# Patient Record
Sex: Male | Born: 1960 | Race: White | Hispanic: No | Marital: Married | Smoking: Current every day smoker
Health system: Southern US, Community
[De-identification: ages and names within clinical notes are randomized; demographics above are authoritative.]

## PROBLEM LIST (undated history)

## (undated) DIAGNOSIS — Z789 Other specified health status: Secondary | ICD-10-CM

## (undated) DIAGNOSIS — I10 Essential (primary) hypertension: Secondary | ICD-10-CM

## (undated) DIAGNOSIS — M1990 Unspecified osteoarthritis, unspecified site: Secondary | ICD-10-CM

## (undated) DIAGNOSIS — F1090 Alcohol use, unspecified, uncomplicated: Secondary | ICD-10-CM

## (undated) DIAGNOSIS — R0609 Other forms of dyspnea: Secondary | ICD-10-CM

## (undated) DIAGNOSIS — I82409 Acute embolism and thrombosis of unspecified deep veins of unspecified lower extremity: Secondary | ICD-10-CM

## (undated) DIAGNOSIS — I2510 Atherosclerotic heart disease of native coronary artery without angina pectoris: Secondary | ICD-10-CM

## (undated) DIAGNOSIS — E119 Type 2 diabetes mellitus without complications: Secondary | ICD-10-CM

## (undated) DIAGNOSIS — Z7289 Other problems related to lifestyle: Secondary | ICD-10-CM

## (undated) DIAGNOSIS — I739 Peripheral vascular disease, unspecified: Secondary | ICD-10-CM

## (undated) HISTORY — PX: RECONSTRUCTION OF NOSE: SHX2301

## (undated) HISTORY — DX: Other specified health status: Z78.9

## (undated) HISTORY — PX: CATARACT EXTRACTION: SUR2

## (undated) HISTORY — DX: Other problems related to lifestyle: Z72.89

## (undated) HISTORY — DX: Alcohol use, unspecified, uncomplicated: F10.90

## (undated) HISTORY — DX: Acute embolism and thrombosis of unspecified deep veins of unspecified lower extremity: I82.409

---

## 2007-06-29 ENCOUNTER — Encounter: Admission: RE | Admit: 2007-06-29 | Discharge: 2007-06-29 | Payer: Self-pay | Admitting: Family Medicine

## 2007-06-29 ENCOUNTER — Ambulatory Visit: Payer: Self-pay | Admitting: Family Medicine

## 2007-06-29 DIAGNOSIS — M25519 Pain in unspecified shoulder: Secondary | ICD-10-CM

## 2007-06-29 DIAGNOSIS — M79609 Pain in unspecified limb: Secondary | ICD-10-CM

## 2007-06-29 HISTORY — DX: Pain in unspecified limb: M79.609

## 2007-06-29 HISTORY — DX: Pain in unspecified shoulder: M25.519

## 2007-06-30 ENCOUNTER — Telehealth (INDEPENDENT_AMBULATORY_CARE_PROVIDER_SITE_OTHER): Payer: Self-pay | Admitting: *Deleted

## 2007-07-11 ENCOUNTER — Ambulatory Visit: Payer: Self-pay | Admitting: Family Medicine

## 2007-07-12 ENCOUNTER — Encounter: Payer: Self-pay | Admitting: Family Medicine

## 2007-07-12 LAB — CONVERTED CEMR LAB
BUN: 14 mg/dL (ref 6–23)
CO2: 20 meq/L (ref 19–32)
Calcium: 9.6 mg/dL (ref 8.4–10.5)
Chloride: 105 meq/L (ref 96–112)
Cholesterol: 190 mg/dL (ref 0–200)
Glucose, Bld: 81 mg/dL (ref 70–99)
Potassium: 4 meq/L (ref 3.5–5.3)
Sodium: 139 meq/L (ref 135–145)
Total CHOL/HDL Ratio: 4
Total Protein: 7.5 g/dL (ref 6.0–8.3)
VLDL: 29 mg/dL (ref 0–40)

## 2007-07-27 ENCOUNTER — Encounter: Payer: Self-pay | Admitting: Family Medicine

## 2008-10-14 ENCOUNTER — Ambulatory Visit: Payer: Self-pay | Admitting: Occupational Medicine

## 2008-10-25 ENCOUNTER — Encounter: Payer: Self-pay | Admitting: Family Medicine

## 2008-12-06 ENCOUNTER — Encounter: Payer: Self-pay | Admitting: Family Medicine

## 2014-11-12 DIAGNOSIS — M75102 Unspecified rotator cuff tear or rupture of left shoulder, not specified as traumatic: Secondary | ICD-10-CM | POA: Insufficient documentation

## 2014-11-12 HISTORY — DX: Unspecified rotator cuff tear or rupture of left shoulder, not specified as traumatic: M75.102

## 2014-12-02 DIAGNOSIS — H25813 Combined forms of age-related cataract, bilateral: Secondary | ICD-10-CM | POA: Insufficient documentation

## 2015-01-08 DIAGNOSIS — F17200 Nicotine dependence, unspecified, uncomplicated: Secondary | ICD-10-CM

## 2015-01-08 DIAGNOSIS — R0683 Snoring: Secondary | ICD-10-CM

## 2015-01-08 HISTORY — DX: Nicotine dependence, unspecified, uncomplicated: F17.200

## 2015-01-08 HISTORY — DX: Snoring: R06.83

## 2015-01-21 DIAGNOSIS — Z961 Presence of intraocular lens: Secondary | ICD-10-CM | POA: Insufficient documentation

## 2015-01-21 DIAGNOSIS — Z9849 Cataract extraction status, unspecified eye: Secondary | ICD-10-CM

## 2015-01-21 HISTORY — DX: Presence of intraocular lens: Z98.49

## 2020-11-14 ENCOUNTER — Other Ambulatory Visit: Payer: Self-pay

## 2020-11-14 ENCOUNTER — Encounter: Payer: Self-pay | Admitting: Medical-Surgical

## 2020-11-14 ENCOUNTER — Ambulatory Visit (INDEPENDENT_AMBULATORY_CARE_PROVIDER_SITE_OTHER): Payer: 59 | Admitting: Medical-Surgical

## 2020-11-14 VITALS — BP 147/62 | HR 80 | Temp 98.0°F | Ht 68.25 in | Wt 214.1 lb

## 2020-11-14 DIAGNOSIS — R7309 Other abnormal glucose: Secondary | ICD-10-CM

## 2020-11-14 DIAGNOSIS — Z7689 Persons encountering health services in other specified circumstances: Secondary | ICD-10-CM | POA: Diagnosis not present

## 2020-11-14 DIAGNOSIS — Z23 Encounter for immunization: Secondary | ICD-10-CM | POA: Diagnosis not present

## 2020-11-14 DIAGNOSIS — J329 Chronic sinusitis, unspecified: Secondary | ICD-10-CM

## 2020-11-14 DIAGNOSIS — J40 Bronchitis, not specified as acute or chronic: Secondary | ICD-10-CM

## 2020-11-14 DIAGNOSIS — Z1159 Encounter for screening for other viral diseases: Secondary | ICD-10-CM

## 2020-11-14 DIAGNOSIS — J302 Other seasonal allergic rhinitis: Secondary | ICD-10-CM

## 2020-11-14 DIAGNOSIS — R030 Elevated blood-pressure reading, without diagnosis of hypertension: Secondary | ICD-10-CM | POA: Diagnosis not present

## 2020-11-14 DIAGNOSIS — Z114 Encounter for screening for human immunodeficiency virus [HIV]: Secondary | ICD-10-CM

## 2020-11-14 DIAGNOSIS — Z1211 Encounter for screening for malignant neoplasm of colon: Secondary | ICD-10-CM

## 2020-11-14 DIAGNOSIS — Z1329 Encounter for screening for other suspected endocrine disorder: Secondary | ICD-10-CM

## 2020-11-14 DIAGNOSIS — Z0000 Encounter for general adult medical examination without abnormal findings: Secondary | ICD-10-CM

## 2020-11-14 DIAGNOSIS — J4520 Mild intermittent asthma, uncomplicated: Secondary | ICD-10-CM

## 2020-11-14 LAB — CBC WITH DIFFERENTIAL/PLATELET
Absolute Monocytes: 248 cells/uL (ref 200–950)
Eosinophils Absolute: 110 cells/uL (ref 15–500)
Monocytes Relative: 3.6 %
Neutrophils Relative %: 61.7 %
Platelets: 136 10*3/uL — ABNORMAL LOW (ref 140–400)

## 2020-11-14 MED ORDER — AZITHROMYCIN 250 MG PO TABS: ORAL_TABLET | ORAL | 0 refills | Status: DC

## 2020-11-14 NOTE — Patient Instructions (Signed)
Tdap (Tetanus, Diphtheria, Pertussis) Vaccine: What You Need to Know 1. Why get vaccinated? Tdap vaccine can prevent tetanus, diphtheria, and pertussis. Diphtheria and pertussis spread from person to person. Tetanus enters the body through cuts or wounds.  TETANUS (T) causes painful stiffening of the muscles. Tetanus can lead to serious health problems, including being unable to open the mouth, having trouble swallowing and breathing, or death.  DIPHTHERIA (D) can lead to difficulty breathing, heart failure, paralysis, or death.  PERTUSSIS (aP), also known as "whooping cough," can cause uncontrollable, violent coughing that makes it hard to breathe, eat, or drink. Pertussis can be extremely serious especially in babies and young children, causing pneumonia, convulsions, brain damage, or death. In teens and adults, it can cause weight loss, loss of bladder control, passing out, and rib fractures from severe coughing. 2. Tdap vaccine Tdap is only for children 7 years and older, adolescents, and adults.  Adolescents should receive a single dose of Tdap, preferably at age 11 or 12 years. Pregnant people should get a dose of Tdap during every pregnancy, preferably during the early part of the third trimester, to help protect the newborn from pertussis. Infants are most at risk for severe, life-threatening complications from pertussis. Adults who have never received Tdap should get a dose of Tdap. Also, adults should receive a booster dose of either Tdap or Td (a different vaccine that protects against tetanus and diphtheria but not pertussis) every 10 years, or after 5 years in the case of a severe or dirty wound or burn. Tdap may be given at the same time as other vaccines. 3. Talk with your health care provider Tell your vaccine provider if the person getting the vaccine:  Has had an allergic reaction after a previous dose of any vaccine that protects against tetanus, diphtheria, or pertussis, or  has any severe, life-threatening allergies  Has had a coma, decreased level of consciousness, or prolonged seizures within 7 days after a previous dose of any pertussis vaccine (DTP, DTaP, or Tdap)  Has seizures or another nervous system problem  Has ever had Guillain-Barr Syndrome (also called "GBS")  Has had severe pain or swelling after a previous dose of any vaccine that protects against tetanus or diphtheria In some cases, your health care provider may decide to postpone Tdap vaccination until a future visit. People with minor illnesses, such as a cold, may be vaccinated. People who are moderately or severely ill should usually wait until they recover before getting Tdap vaccine.  Your health care provider can give you more information. 4. Risks of a vaccine reaction  Pain, redness, or swelling where the shot was given, mild fever, headache, feeling tired, and nausea, vomiting, diarrhea, or stomachache sometimes happen after Tdap vaccination. People sometimes faint after medical procedures, including vaccination. Tell your provider if you feel dizzy or have vision changes or ringing in the ears.  As with any medicine, there is a very remote chance of a vaccine causing a severe allergic reaction, other serious injury, or death. 5. What if there is a serious problem? An allergic reaction could occur after the vaccinated person leaves the clinic. If you see signs of a severe allergic reaction (hives, swelling of the face and throat, difficulty breathing, a fast heartbeat, dizziness, or weakness), call 9-1-1 and get the person to the nearest hospital. For other signs that concern you, call your health care provider.  Adverse reactions should be reported to the Vaccine Adverse Event Reporting System (VAERS). Your health   care provider will usually file this report, or you can do it yourself. Visit the VAERS website at www.vaers.hhs.gov or call 1-800-822-7967. VAERS is only for reporting  reactions, and VAERS staff members do not give medical advice. 6. The National Vaccine Injury Compensation Program The National Vaccine Injury Compensation Program (VICP) is a federal program that was created to compensate people who may have been injured by certain vaccines. Claims regarding alleged injury or death due to vaccination have a time limit for filing, which may be as short as two years. Visit the VICP website at www.hrsa.gov/vaccinecompensation or call 1-800-338-2382 to learn about the program and about filing a claim. 7. How can I learn more?  Ask your health care provider.  Call your local or state health department.  Visit the website of the Food and Drug Administration (FDA) for vaccine package inserts and additional information at www.fda.gov/vaccines-blood-biologics/vaccines.  Contact the Centers for Disease Control and Prevention (CDC): ? Call 1-800-232-4636 (1-800-CDC-INFO) or ? Visit CDC's website at www.cdc.gov/vaccines. Vaccine Information Statement Tdap (Tetanus, Diphtheria, Pertussis) Vaccine (03/21/2020) This information is not intended to replace advice given to you by your health care provider. Make sure you discuss any questions you have with your health care provider. Document Revised: 04/16/2020 Document Reviewed: 04/16/2020 Elsevier Patient Education  2021 Elsevier Inc.  

## 2020-11-14 NOTE — Progress Notes (Signed)
New Patient Office Visit  Subjective:  Patient ID: Spencer Bishop, male    DOB: 03/18/1961  Age: 60 y.o. MRN: [id number]  CC:  Chief Complaint  Patient presents with  . Establish Care    HPI UNO ESAU presents to establish care.   History of asthma diagnosed in his childhood.  Usually only flares when seasonal allergies flareup.  Uses an albuterol inhaler up to 2 times daily to help manage his symptoms at most but there are times when he is able to go without having to use it.  Over the last 2 weeks, his seasonal allergies have gotten the best at him and he is struggling with sinus pressure/congestion, coughing, postnasal drip, and facial pain.  He has started using Flonase but this has not made much of a difference over the last week.  Blood pressure is elevated in office today at 147/62.  Wife notes that it was worse last year and has improved.  He does add salt to his foods.  He has a physically demanding job but no intentional exercise.  Wife notes that lab work done around 1 year ago showed an elevated glucose but he has never been diagnosed with diabetes.  Past Medical History:  Diagnosis Date  . Alcohol use     Past Surgical History:  Procedure Laterality Date  . RECONSTRUCTION OF NOSE      Family History  Problem Relation Age of Onset  . Heart attack Mother   . Diabetes Mother   . Heart attack Father     Social History   Socioeconomic History  . Marital status: Married    Spouse name: Not on file  . Number of children: Not on file  . Years of education: Not on file  . Highest education level: Not on file  Occupational History  . Not on file  Tobacco Use  . Smoking status: Current Every Day Smoker    Packs/day: 1.50    Types: Cigarettes  . Smokeless tobacco: Never Used  Substance and Sexual Activity  . Alcohol use: Yes    Alcohol/week: 7.0 - 14.0 standard drinks    Types: 7 - 14 Standard drinks or equivalent per week  . Drug use: Yes    Frequency:  7.0 times per week    Types: Marijuana    Comment: daily use  . Sexual activity: Yes    Partners: Female    Birth control/protection: Surgical  Other Topics Concern  . Not on file  Social History Narrative  . Not on file   Social Determinants of Health   Financial Resource Strain: Not on file  Food Insecurity: Not on file  Transportation Needs: Not on file  Physical Activity: Not on file  Stress: Not on file  Social Connections: Not on file  Intimate Partner Violence: Not on file    ROS Review of Systems  Constitutional: Negative for chills, fatigue, fever and unexpected weight change.  HENT: Positive for congestion, hearing loss, postnasal drip and voice change.   Respiratory: Positive for cough and wheezing.   Cardiovascular: Negative for chest pain, palpitations and leg swelling.  Gastrointestinal: Positive for abdominal pain (Heartburn). Negative for constipation, diarrhea, nausea and vomiting.  Genitourinary: Negative for dysuria, frequency and urgency.  Musculoskeletal: Positive for arthralgias and myalgias.  Neurological: Positive for headaches (With increased sinus pressure). Negative for dizziness and light-headedness.  Hematological: Bruises/bleeds easily.  Psychiatric/Behavioral: Negative for dysphoric mood, self-injury, sleep disturbance and suicidal ideas. The patient is not nervous/anxious.  Objective:   Today's Vitals: BP (!) 147/62   Pulse 80   Temp 98 F (36.7 C)   Ht 5' 8.25" (1.734 m)   Wt 214 lb 1.6 oz (97.1 kg)   SpO2 95%   BMI 32.32 kg/m   Physical Exam Vitals and nursing note reviewed.  Constitutional:      General: He is not in acute distress.    Appearance: Normal appearance.  HENT:     Head: Normocephalic and atraumatic.  Cardiovascular:     Rate and Rhythm: Normal rate and regular rhythm.     Pulses: Normal pulses.     Heart sounds: Normal heart sounds. No murmur heard. No friction rub. No gallop.   Pulmonary:     Effort:  Pulmonary effort is normal. No respiratory distress.     Breath sounds: Normal breath sounds.  Skin:    General: Skin is warm and dry.  Neurological:     Mental Status: He is alert and oriented to person, place, and time.  Psychiatric:        Mood and Affect: Mood normal.        Behavior: Behavior normal.        Thought Content: Thought content normal.        Judgment: Judgment normal.    Assessment & Plan:   1. Encounter to establish care Reviewed available information and discussed healthcare concerns with patient.  2. Elevated blood pressure reading in office without diagnosis of hypertension Blood pressure elevated today with elevation in the past (not available in care everywhere).  Checking CBC with differential and CMP.  Recommend limiting dietary sodium and avoiding processed/fast foods. - CBC with Differential/Platelet - COMPLETE METABOLIC PANEL WITH GFR  3. Elevated glucose With a history of elevated glucose, checking a hemoglobin A1c today. - Hemoglobin A1c  4. Sinobronchitis Seasonal allergies with progressive worsening over the past 2 weeks and no improvement with home interventions.  Start azithromycin 500 mg on day 1 then 250 mg daily x4 days.  5. Preventative health care Checking cholesterol panel. - Lipid panel  6. Mild intermittent asthma without complication Continue albuterol as needed.  Consider adding Symbicort for better control.  7. Screening for endocrine disorder Checking TSH and hemoglobin A1c. - TSH - Hemoglobin A1c  8. Seasonal allergies Continue Flonase daily.  Recommend adding daily antihistamine such as Zyrtec, Allegra, Claritin, or Xyzal.   Outpatient Encounter Medications as of 11/14/2020  Medication Sig  . albuterol (VENTOLIN HFA) 108 (90 Base) MCG/ACT inhaler Inhale 2 puffs into the lungs every 6 (six) hours as needed for wheezing or cough.  Marland Kitchen azithromycin (ZITHROMAX) 250 MG tablet 2 tabs po x1 on Day 1, then 1 tab po daily on Days 2  - 5  . dextromethorphan-guaiFENesin (MUCINEX DM) 30-600 MG 12hr tablet Take 1 tablet by mouth every 12 (twelve) hours.  . naproxen sodium (ALEVE) 220 MG tablet Take 220 mg by mouth 2 (two) times daily as needed.   No facility-administered encounter medications on file as of 11/14/2020.    Follow-up: Return in about 2 weeks (around 11/28/2020) for nurse visit for BP check.   Thayer Ohm, DNP, APRN, FNP-BC [facility]

## 2020-11-15 LAB — COMPLETE METABOLIC PANEL WITH GFR
AG Ratio: 1.8 (calc) (ref 1.0–2.5)
ALT: 21 U/L (ref 9–46)
AST: 19 U/L (ref 10–35)
Albumin: 4.4 g/dL (ref 3.6–5.1)
Alkaline phosphatase (APISO): 147 U/L — ABNORMAL HIGH (ref 35–144)
BUN: 11 mg/dL (ref 7–25)
CO2: 24 mmol/L (ref 20–32)
Calcium: 9.6 mg/dL (ref 8.6–10.3)
Chloride: 99 mmol/L (ref 98–110)
Creat: 0.89 mg/dL (ref 0.70–1.33)
GFR, Est African American: 109 mL/min/{1.73_m2} (ref >=60)
GFR, Est Non African American: 94 mL/min/{1.73_m2} (ref >=60)
Globulin: 2.5 g/dL (calc) (ref 1.9–3.7)
Glucose, Bld: 266 mg/dL — ABNORMAL HIGH (ref 65–99)
Potassium: 4.5 mmol/L (ref 3.5–5.3)
Sodium: 134 mmol/L — ABNORMAL LOW (ref 135–146)
Total Bilirubin: 0.4 mg/dL (ref 0.2–1.2)
Total Protein: 6.9 g/dL (ref 6.1–8.1)

## 2020-11-15 LAB — CBC WITH DIFFERENTIAL/PLATELET
Basophils Absolute: 69 cells/uL (ref 0–200)
Basophils Relative: 1 %
Eosinophils Relative: 1.6 %
HCT: 49.5 % (ref 38.5–50.0)
Hemoglobin: 16.7 g/dL (ref 13.2–17.1)
Lymphs Abs: 2215 cells/uL (ref 850–3900)
MCH: 32.9 pg (ref 27.0–33.0)
MCHC: 33.7 g/dL (ref 32.0–36.0)
MCV: 97.4 fL (ref 80.0–100.0)
MPV: 10.8 fL (ref 7.5–12.5)
Neutro Abs: 4257 cells/uL (ref 1500–7800)
RBC: 5.08 10*6/uL (ref 4.20–5.80)
RDW: 12.7 % (ref 11.0–15.0)
Total Lymphocyte: 32.1 %
WBC: 6.9 10*3/uL (ref 3.8–10.8)

## 2020-11-15 LAB — TSH: TSH: 0.79 mIU/L (ref 0.40–4.50)

## 2020-11-15 LAB — LIPID PANEL
Cholesterol: 170 mg/dL (ref <200)
HDL: 43 mg/dL (ref >=40)
LDL Cholesterol (Calc): 91 mg/dL (calc)
Non-HDL Cholesterol (Calc): 127 mg/dL (calc) (ref <130)
Total CHOL/HDL Ratio: 4 (calc) (ref <5.0)
Triglycerides: 273 mg/dL — ABNORMAL HIGH (ref <150)

## 2020-11-15 LAB — HEMOGLOBIN A1C
Hgb A1c MFr Bld: 11.6 % of total Hgb — ABNORMAL HIGH (ref <5.7)
Mean Plasma Glucose: 286 mg/dL
eAG (mmol/L): 15.9 mmol/L

## 2020-11-18 ENCOUNTER — Ambulatory Visit (INDEPENDENT_AMBULATORY_CARE_PROVIDER_SITE_OTHER): Payer: 59 | Admitting: Medical-Surgical

## 2020-11-18 ENCOUNTER — Encounter: Payer: Self-pay | Admitting: Medical-Surgical

## 2020-11-18 ENCOUNTER — Other Ambulatory Visit: Payer: Self-pay

## 2020-11-18 VITALS — BP 159/85 | HR 76 | Temp 98.4°F | Ht 68.25 in | Wt 213.6 lb

## 2020-11-18 DIAGNOSIS — E1165 Type 2 diabetes mellitus with hyperglycemia: Secondary | ICD-10-CM

## 2020-11-18 MED ORDER — METFORMIN HCL ER 500 MG PO TB24: ORAL_TABLET | ORAL | 11 refills | Status: DC

## 2020-11-18 MED ORDER — BLOOD GLUCOSE MONITOR KIT: PACK | 0 refills | Status: AC

## 2020-11-18 NOTE — Progress Notes (Signed)
Subjective:    CC: Discuss lab results  HPI: Pleasant 60 year old male accompanied by his wife presenting today to discuss his recent lab results.  His hemoglobin A1c resulted at 11.6 indicating new onset diabetes.  He does have a family history of diabetes in his mother.  No numbness or tingling to his feet and performs frequent foot care throughout the day, changing his socks several times daily.  History of cataracts with surgical intervention but no recent optometry/ophthalmology evaluation.  I reviewed the past medical history, family history, social history, surgical history, and allergies today and no changes were needed.  Please see the problem list section below in epic for further details.  Past Medical History: Past Medical History:  Diagnosis Date  . Alcohol use    Past Surgical History: Past Surgical History:  Procedure Laterality Date  . RECONSTRUCTION OF NOSE     Social History: Social History   Socioeconomic History  . Marital status: Married    Spouse name: Not on file  . Number of children: Not on file  . Years of education: Not on file  . Highest education level: Not on file  Occupational History  . Not on file  Tobacco Use  . Smoking status: Current Every Day Smoker    Packs/day: 1.50    Types: Cigarettes  . Smokeless tobacco: Never Used  Substance and Sexual Activity  . Alcohol use: Yes    Alcohol/week: 7.0 - 14.0 standard drinks    Types: 7 - 14 Standard drinks or equivalent per week  . Drug use: Yes    Frequency: 7.0 times per week    Types: Marijuana    Comment: daily use  . Sexual activity: Yes    Partners: Female    Birth control/protection: Surgical  Other Topics Concern  . Not on file  Social History Narrative  . Not on file   Social Determinants of Health   Financial Resource Strain: Not on file  Food Insecurity: Not on file  Transportation Needs: Not on file  Physical Activity: Not on file  Stress: Not on file  Social  Connections: Not on file   Family History: Family History  Problem Relation Age of Onset  . Heart attack Mother   . Diabetes Mother   . Heart attack Father    Allergies: Allergies  Allergen Reactions  . Tylenol [Acetaminophen] Itching   Medications: See med rec.  Review of Systems: See HPI for pertinent positives and negatives.   Objective:    General: Well Developed, well nourished, and in no acute distress.  Neuro: Alert and oriented x3.  HEENT: Normocephalic, atraumatic.  Skin: Warm and dry. Cardiac: Regular rate and rhythm.  Respiratory:  Not using accessory muscles, speaking in full sentences.   Impression and Recommendations:    1. Type 2 diabetes mellitus with hyperglycemia, without long-term current use of insulin (HCC) Starting Metformin 500 mg XR daily for 7 days then increase to twice daily with breakfast and dinner.  Prescription printed for glucometer and provided to patient with instructions to take it to his pharmacy or the pharmacist will show him what glucometer is covered by his insurance.  Printed information regarding diabetic nutrition provided.  Glucose monitoring logs printed and given to patient.  Recommend checking fasting glucose first thing in the morning daily with occasionally checking 2 hours postprandially.  Discussed diabetic care recommendations to start ACE/ARB as well as a statin medication.  Because he has historically not taken a lot of medications, we  will start Metformin first and if tolerating then look to add in other medications.  Referral to optometry placed for diabetic eye exam.  Diabetic foot exam completed today.  Unable to provide urine specimen for urine microalbumin. - Ambulatory referral to Optometry  Return for nurse visit for BP check as scheduled. ___________________________________________ Thayer Ohm, DNP, APRN, FNP-BC Primary Care and Sports Medicine [facility]

## 2020-12-02 ENCOUNTER — Ambulatory Visit (INDEPENDENT_AMBULATORY_CARE_PROVIDER_SITE_OTHER): Payer: 59 | Admitting: Medical-Surgical

## 2020-12-02 ENCOUNTER — Other Ambulatory Visit: Payer: Self-pay

## 2020-12-02 VITALS — BP 145/81 | HR 65 | Temp 98.5°F | Resp 20 | Ht 68.25 in | Wt 214.0 lb

## 2020-12-02 DIAGNOSIS — R030 Elevated blood-pressure reading, without diagnosis of hypertension: Secondary | ICD-10-CM

## 2020-12-02 MED ORDER — VALSARTAN 40 MG PO TABS: 40.0000 mg | ORAL_TABLET | Freq: Every day | ORAL | 3 refills | Status: DC

## 2020-12-02 NOTE — Progress Notes (Signed)
Established Patient Office Visit  Subjective:  Patient ID: BAKER MORONTA, male    DOB: [birth year]  Age: 60 y.o. MRN: [id number]  CC:  Chief Complaint  Patient presents with  . Hypertension    HPI Spencer Bishop presents for a BP check. First BP 160/81, Second BP 145/81.   Past Medical History:  Diagnosis Date  . Alcohol use     Past Surgical History:  Procedure Laterality Date  . RECONSTRUCTION OF NOSE      Family History  Problem Relation Age of Onset  . Heart attack Mother   . Diabetes Mother   . Heart attack Father     Social History   Socioeconomic History  . Marital status: Married    Spouse name: Not on file  . Number of children: Not on file  . Years of education: Not on file  . Highest education level: Not on file  Occupational History  . Not on file  Tobacco Use  . Smoking status: Current Every Day Smoker    Packs/day: 1.50    Types: Cigarettes  . Smokeless tobacco: Never Used  Substance and Sexual Activity  . Alcohol use: Yes    Alcohol/week: 7.0 - 14.0 standard drinks    Types: 7 - 14 Standard drinks or equivalent per week  . Drug use: Yes    Frequency: 7.0 times per week    Types: Marijuana    Comment: daily use  . Sexual activity: Yes    Partners: Female    Birth control/protection: Surgical  Other Topics Concern  . Not on file  Social History Narrative  . Not on file   Social Determinants of Health   Financial Resource Strain: Not on file  Food Insecurity: Not on file  Transportation Needs: Not on file  Physical Activity: Not on file  Stress: Not on file  Social Connections: Not on file  Intimate Partner Violence: Not on file    Outpatient Medications Prior to Visit  Medication Sig Dispense Refill  . albuterol (VENTOLIN HFA) 108 (90 Base) MCG/ACT inhaler Inhale 2 puffs into the lungs every 6 (six) hours as needed for wheezing or cough.    Marland Kitchen azithromycin (ZITHROMAX) 250 MG tablet 2 tabs po x1 on Day 1, then 1 tab po daily on  Days 2 - 5 6 tablet 0  . blood glucose meter kit and supplies KIT Dispense based on patient and insurance preference. Use up to four times daily as directed. Please include lancets, test strips, control solution. 1 each 0  . dextromethorphan-guaiFENesin (MUCINEX DM) 30-600 MG 12hr tablet Take 1 tablet by mouth every 12 (twelve) hours.    . metFORMIN (GLUCOPHAGE XR) 500 MG 24 hr tablet Take 1 tablet (500 mg total) by mouth daily with breakfast for 7 days, THEN 1 tablet (500 mg total) in the morning and at bedtime. 60 tablet 11  . naproxen sodium (ALEVE) 220 MG tablet Take 220 mg by mouth 2 (two) times daily as needed.     No facility-administered medications prior to visit.    Allergies  Allergen Reactions  . Tylenol [Acetaminophen] Itching    ROS Review of Systems    Objective:    Physical Exam  BP (!) 160/81 (BP Location: Left Arm, Patient Position: Sitting, Cuff Size: Large)   Pulse 65   Temp 98.5 F (36.9 C) (Oral)   Resp 20   Ht 5' 8.25" (1.734 m)   Wt 214 lb (97.1 kg)   SpO2  100%   BMI 32.30 kg/m  Wt Readings from Last 3 Encounters:  12/02/20 214 lb (97.1 kg)  11/18/20 213 lb 9.6 oz (96.9 kg)  11/14/20 214 lb 1.6 oz (97.1 kg)     Health Maintenance Due  Topic Date Due  . Hepatitis C Screening  Never done  . COVID-19 Vaccine (1) Never done  . URINE MICROALBUMIN  Never done  . HIV Screening  Never done  . COLONOSCOPY (Pts 45-56yr Insurance coverage will need to be confirmed)  Never done    There are no preventive care reminders to display for this patient.  Lab Results  Component Value Date   TSH 0.79 11/14/2020   Lab Results  Component Value Date   WBC 6.9 11/14/2020   HGB 16.7 11/14/2020   HCT 49.5 11/14/2020   MCV 97.4 11/14/2020   PLT 136 (L) 11/14/2020   Lab Results  Component Value Date   NA 134 (L) 11/14/2020   K 4.5 11/14/2020   CO2 24 11/14/2020   GLUCOSE 266 (H) 11/14/2020   BUN 11 11/14/2020   CREATININE 0.89 11/14/2020   BILITOT  0.4 11/14/2020   ALKPHOS 99 07/12/2007   AST 19 11/14/2020   ALT 21 11/14/2020   PROT 6.9 11/14/2020   ALBUMIN 4.9 07/12/2007   CALCIUM 9.6 11/14/2020   Lab Results  Component Value Date   CHOL 170 11/14/2020   Lab Results  Component Value Date   HDL 43 11/14/2020   Lab Results  Component Value Date   LDLCALC 91 11/14/2020   Lab Results  Component Value Date   TRIG 273 (H) 11/14/2020   Lab Results  Component Value Date   CHOLHDL 4.0 11/14/2020   Lab Results  Component Value Date   HGBA1C 11.6 (H) 11/14/2020      Assessment & Plan:  Continue current medications, [facility] Follow up in two weeks for a BP check. Problem List Items Addressed This Visit   None   Visit Diagnoses    Elevated blood pressure reading in office without diagnosis of hypertension    -  Primary      No orders of the defined types were placed in this encounter.   Follow-up: Return in about 2 weeks (around 12/16/2020) for BP check.    Spencer Bishop CMA

## 2020-12-16 ENCOUNTER — Other Ambulatory Visit: Payer: Self-pay

## 2020-12-16 ENCOUNTER — Ambulatory Visit (INDEPENDENT_AMBULATORY_CARE_PROVIDER_SITE_OTHER): Payer: 59 | Admitting: Medical-Surgical

## 2020-12-16 VITALS — BP 128/69 | HR 68

## 2020-12-16 DIAGNOSIS — E1165 Type 2 diabetes mellitus with hyperglycemia: Secondary | ICD-10-CM

## 2020-12-16 DIAGNOSIS — I10 Essential (primary) hypertension: Secondary | ICD-10-CM

## 2020-12-16 MED ORDER — FREESTYLE LITE TEST VI STRP: ORAL_STRIP | 12 refills | Status: AC

## 2020-12-16 MED ORDER — FREESTYLE LANCETS MISC: 12 refills | Status: AC

## 2020-12-16 NOTE — Progress Notes (Signed)
Blood pressure well controlled. Continue Valsartan 40mg  daily. Blood sugars look fantastic. Continue Metformin as prescribed.   , DNP, APRN, FNP-BC [facility] and [facility] Medicine

## 2020-12-16 NOTE — Progress Notes (Signed)
Established Patient Office Visit  Subjective:  Patient ID: Spencer Bishop, male    DOB: January 31, 1961  Age: 60 y.o. MRN: [id number]  CC:  Chief Complaint  Patient presents with  . Hypertension    HPI AVIER Bishop presents for blood pressure check. Denies chest pain, shortness of breath or headaches.   DM - He needs more strips and lancets for his FreeStyle Lite meter. Fasting blood sugars look good. He states she has switched from Pepsi to water daily.    4/21 - 117 mg/dl 4/22 - 116 mg/dl 4/23 - 108 mg/dl 4/24 - 113 mg/dl 4/25 - 101 mg/dl 4/26 - 108 mg/dl 4/27 - 115 mg/dl 4/28 - 103 mg/dl 4/29 - 127 mg/dl 5/1   - 129 mg/dl 5/2   - 102 mg/dl 5/3   - 110 mg/dl  Past Medical History:  Diagnosis Date  . Alcohol use     Past Surgical History:  Procedure Laterality Date  . RECONSTRUCTION OF NOSE      Family History  Problem Relation Age of Onset  . Heart attack Mother   . Diabetes Mother   . Heart attack Father     Social History   Socioeconomic History  . Marital status: Married    Spouse name: Not on file  . Number of children: Not on file  . Years of education: Not on file  . Highest education level: Not on file  Occupational History  . Not on file  Tobacco Use  . Smoking status: Current Every Day Smoker    Packs/day: 1.50    Types: Cigarettes  . Smokeless tobacco: Never Used  Substance and Sexual Activity  . Alcohol use: Yes    Alcohol/week: 7.0 - 14.0 standard drinks    Types: 7 - 14 Standard drinks or equivalent per week  . Drug use: Yes    Frequency: 7.0 times per week    Types: Marijuana    Comment: daily use  . Sexual activity: Yes    Partners: Female    Birth control/protection: Surgical  Other Topics Concern  . Not on file  Social History Narrative  . Not on file   Social Determinants of Health   Financial Resource Strain: Not on file  Food Insecurity: Not on file  Transportation Needs: Not on file  Physical Activity: Not on file   Stress: Not on file  Social Connections: Not on file  Intimate Partner Violence: Not on file    Outpatient Medications Prior to Visit  Medication Sig Dispense Refill  . albuterol (VENTOLIN HFA) 108 (90 Base) MCG/ACT inhaler Inhale 2 puffs into the lungs every 6 (six) hours as needed for wheezing or cough.    . blood glucose meter kit and supplies KIT Dispense based on patient and insurance preference. Use up to four times daily as directed. Please include lancets, test strips, control solution. 1 each 0  . metFORMIN (GLUCOPHAGE XR) 500 MG 24 hr tablet Take 1 tablet (500 mg total) by mouth daily with breakfast for 7 days, THEN 1 tablet (500 mg total) in the morning and at bedtime. 60 tablet 11  . naproxen sodium (ALEVE) 220 MG tablet Take 220 mg by mouth 2 (two) times daily as needed.    . valsartan (DIOVAN) 40 MG tablet Take 1 tablet (40 mg total) by mouth daily. 90 tablet 3  . azithromycin (ZITHROMAX) 250 MG tablet 2 tabs po x1 on Day 1, then 1 tab po daily on Days 2 -  5 6 tablet 0  . dextromethorphan-guaiFENesin (MUCINEX DM) 30-600 MG 12hr tablet Take 1 tablet by mouth every 12 (twelve) hours.     No facility-administered medications prior to visit.    Allergies  Allergen Reactions  . Tylenol [Acetaminophen] Itching    ROS Review of Systems    Objective:    Physical Exam  BP 128/69   Pulse 68   SpO2 100%  Wt Readings from Last 3 Encounters:  12/02/20 214 lb (97.1 kg)  11/18/20 213 lb 9.6 oz (96.9 kg)  11/14/20 214 lb 1.6 oz (97.1 kg)     Health Maintenance Due  Topic Date Due  . Hepatitis C Screening  Never done  . COVID-19 Vaccine (1) Never done  . HIV Screening  Never done  . COLONOSCOPY (Pts 45-108yr Insurance coverage will need to be confirmed)  Never done    There are no preventive care reminders to display for this patient.  Lab Results  Component Value Date   TSH 0.79 11/14/2020   Lab Results  Component Value Date   WBC 6.9 11/14/2020   HGB 16.7  11/14/2020   HCT 49.5 11/14/2020   MCV 97.4 11/14/2020   PLT 136 (L) 11/14/2020   Lab Results  Component Value Date   NA 134 (L) 11/14/2020   K 4.5 11/14/2020   CO2 24 11/14/2020   GLUCOSE 266 (H) 11/14/2020   BUN 11 11/14/2020   CREATININE 0.89 11/14/2020   BILITOT 0.4 11/14/2020   ALKPHOS 99 07/12/2007   AST 19 11/14/2020   ALT 21 11/14/2020   PROT 6.9 11/14/2020   ALBUMIN 4.9 07/12/2007   CALCIUM 9.6 11/14/2020   Lab Results  Component Value Date   CHOL 170 11/14/2020   Lab Results  Component Value Date   HDL 43 11/14/2020   Lab Results  Component Value Date   LDLCALC 91 11/14/2020   Lab Results  Component Value Date   TRIG 273 (H) 11/14/2020   Lab Results  Component Value Date   CHOLHDL 4.0 11/14/2020   Lab Results  Component Value Date   HGBA1C 11.6 (H) 11/14/2020      Assessment & Plan:  HTN - blood pressure within normal limits. Patient advised to continue current medications.   DM - Sent lancets and strips to the pharmacy.  Problem List Items Addressed This Visit   None   Visit Diagnoses    Essential hypertension, benign    -  Primary   Type 2 diabetes mellitus with hyperglycemia, without long-term current use of insulin ([place]          Meds ordered this encounter  Medications  . Lancets (FREESTYLE) lancets    Sig: Dx DM E11.9 Check fasting blood sugar daily and 2 hours after largest meal 2 times a week.    Dispense:  100 each    Refill:  12  . glucose blood (FREESTYLE LITE) test strip    Sig: Dx DM E11.9 Check fasting blood sugar daily and 2 hours after largest meal 2 times a week.    Dispense:  100 each    Refill:  12    Follow-up: Return in about 3 months (around 03/18/2021) for DM and HTN with Joy. .Durene Romans AMonico Blitz CM[place]calm

## 2021-07-03 ENCOUNTER — Other Ambulatory Visit: Payer: Self-pay

## 2021-07-03 ENCOUNTER — Ambulatory Visit (INDEPENDENT_AMBULATORY_CARE_PROVIDER_SITE_OTHER): Payer: 59 | Admitting: Medical-Surgical

## 2021-07-03 ENCOUNTER — Encounter: Payer: Self-pay | Admitting: Medical-Surgical

## 2021-07-03 ENCOUNTER — Ambulatory Visit (INDEPENDENT_AMBULATORY_CARE_PROVIDER_SITE_OTHER): Payer: 59

## 2021-07-03 VITALS — BP 142/81 | HR 73 | Resp 20 | Ht 68.25 in | Wt 197.0 lb

## 2021-07-03 DIAGNOSIS — M25551 Pain in right hip: Secondary | ICD-10-CM

## 2021-07-03 DIAGNOSIS — I10 Essential (primary) hypertension: Secondary | ICD-10-CM

## 2021-07-03 DIAGNOSIS — G8929 Other chronic pain: Secondary | ICD-10-CM | POA: Diagnosis not present

## 2021-07-03 DIAGNOSIS — E1165 Type 2 diabetes mellitus with hyperglycemia: Secondary | ICD-10-CM

## 2021-07-03 HISTORY — DX: Type 2 diabetes mellitus with hyperglycemia: E11.65

## 2021-07-03 LAB — POCT GLYCOSYLATED HEMOGLOBIN (HGB A1C): HbA1c, POC (controlled diabetic range): 5.4 % (ref 0.0–7.0)

## 2021-07-03 MED ORDER — CELECOXIB 200 MG PO CAPS: ORAL_CAPSULE | ORAL | 2 refills | Status: DC

## 2021-07-03 NOTE — Progress Notes (Signed)
  HPI with pertinent ROS:   CC: leg pain  HPI: Pleasant 60 year old male accompanied by his wife presenting for the following:  Diabetes follow-up-as not been checking sugars regularly recently.  Taking metformin twice daily as prescribed, tolerating well without side effects.  Last A1c 11.6%.  Reports significant dietary modifications and lifestyle changes.  Has lost approximately 17 pounds in the last 6 months.  HTN- BP slightly elevated today but patient is in a bit of pain. Not checking BP at home. Taking Valsartan 40mg  daily as prescribed. Denies CP, SOB, palpitations, lower extremity edema, dizziness, headaches, or vision changes.  Right hip pain-about a year ago, he had a fall after slipping.  He noted that there was a pop in his right groin/hip area.  Since then he has had intermittent pain off and on.  Over the last month, this pain has gotten worse and become more constant.  When standing, he does okay although after standing for a while, the area involved can get a bit achy.  Most painful when sitting, moving from sitting to standing or from standing to sitting.  Taking Aleve twice daily without benefit.  No numbness and tingling or weakness of the leg.  I reviewed the past medical history, family history, social history, surgical history, and allergies today and no changes were needed.  Please see the problem list section below in epic for further details.   Physical exam:   General: Well Developed, well nourished, and in no acute distress.  Neuro: Alert and oriented x3.  HEENT: Normocephalic, atraumatic.  Skin: Warm and dry. Cardiac: Regular rate and rhythm, no murmurs rubs or gallops, no lower extremity edema.  Respiratory: Clear to auscultation bilaterally. Not using accessory muscles, speaking in full sentences.  Impression and Recommendations:    1. Type 2 diabetes mellitus with hyperglycemia, without long-term current use of insulin (HCC) POCT hemoglobin A1c 5.4% today  which was astounding.  Given such a large stroke, okay to reduce metformin to 500 mg once daily and continue dietary and lifestyle modifications.  Checking labs. - POCT HgB A1C - COMPLETE METABOLIC PANEL WITH GFR - Lipid panel  2. Essential hypertension, benign Checking labs today.  Although blood pressure is little elevated this is likely in response to his current level of pain.  Continue valsartan 40 mg daily as prescribed. - CBC with Differential/Platelet - COMPLETE METABOLIC PANEL WITH GFR  3. Right hip pain Getting x-rays of the left hip today.  Suspect labral tear.  It flexor exercises provided and information on labral tear printed off and given to patient.  Recommend conservative management at home for the next 4 to 6 weeks with regular home exercises and anti-inflammatories.  Sending in Celebrex 200 mg twice daily to see if this is more effective than the Aleve.  Advised patient to avoid other ibuprofen type products while taking Celebrex.  Okay to use heat/ice or other conservative pain measures as needed.  Offered a small supply of tramadol for use for severe pain and patient declined today. - DG Hip Unilat W OR W/O Pelvis Min 4 Views Right; Future  Return in about 4 weeks (around 07/31/2021) for hip pain follow up with Dr. 08/02/2021. ___________________________________________ Karie Schwalbe, DNP, APRN, FNP-BC Primary Care and Sports Medicine [facility]

## 2021-07-04 LAB — LIPID PANEL
Cholesterol: 177 mg/dL (ref <200)
HDL: 50 mg/dL (ref >=40)
LDL Cholesterol (Calc): 99 mg/dL (calc)
Non-HDL Cholesterol (Calc): 127 mg/dL (calc) (ref <130)
Total CHOL/HDL Ratio: 3.5 (calc) (ref <5.0)
Triglycerides: 182 mg/dL — ABNORMAL HIGH (ref <150)

## 2021-07-04 LAB — CBC WITH DIFFERENTIAL/PLATELET
Absolute Monocytes: 393 cells/uL (ref 200–950)
Basophils Absolute: 69 cells/uL (ref 0–200)
Basophils Relative: 1 %
Eosinophils Absolute: 159 cells/uL (ref 15–500)
Eosinophils Relative: 2.3 %
HCT: 48 % (ref 38.5–50.0)
Hemoglobin: 16.9 g/dL (ref 13.2–17.1)
Lymphs Abs: 2312 cells/uL (ref 850–3900)
MCH: 34.7 pg — ABNORMAL HIGH (ref 27.0–33.0)
MCHC: 35.2 g/dL (ref 32.0–36.0)
MCV: 98.6 fL (ref 80.0–100.0)
MPV: 10 fL (ref 7.5–12.5)
Monocytes Relative: 5.7 %
Neutro Abs: 3968 cells/uL (ref 1500–7800)
Neutrophils Relative %: 57.5 %
Platelets: 165 10*3/uL (ref 140–400)
RBC: 4.87 10*6/uL (ref 4.20–5.80)
RDW: 12.5 % (ref 11.0–15.0)
Total Lymphocyte: 33.5 %
WBC: 6.9 10*3/uL (ref 3.8–10.8)

## 2021-07-04 LAB — COMPLETE METABOLIC PANEL WITH GFR
AG Ratio: 1.8 (calc) (ref 1.0–2.5)
ALT: 12 U/L (ref 9–46)
AST: 14 U/L (ref 10–35)
Albumin: 4.4 g/dL (ref 3.6–5.1)
Alkaline phosphatase (APISO): 91 U/L (ref 35–144)
BUN: 15 mg/dL (ref 7–25)
CO2: 23 mmol/L (ref 20–32)
Calcium: 10.2 mg/dL (ref 8.6–10.3)
Chloride: 105 mmol/L (ref 98–110)
Creat: 0.88 mg/dL (ref 0.70–1.30)
Globulin: 2.4 g/dL (calc) (ref 1.9–3.7)
Glucose, Bld: 89 mg/dL (ref 65–139)
Potassium: 4.4 mmol/L (ref 3.5–5.3)
Sodium: 141 mmol/L (ref 135–146)
Total Bilirubin: 0.4 mg/dL (ref 0.2–1.2)
Total Protein: 6.8 g/dL (ref 6.1–8.1)
eGFR: 100 mL/min/{1.73_m2} (ref >=60)

## 2021-07-06 ENCOUNTER — Encounter: Payer: Self-pay | Admitting: Medical-Surgical

## 2021-07-06 DIAGNOSIS — E785 Hyperlipidemia, unspecified: Secondary | ICD-10-CM

## 2021-07-06 DIAGNOSIS — E1169 Type 2 diabetes mellitus with other specified complication: Secondary | ICD-10-CM

## 2021-07-06 DIAGNOSIS — E1165 Type 2 diabetes mellitus with hyperglycemia: Secondary | ICD-10-CM

## 2021-07-06 MED ORDER — ROSUVASTATIN CALCIUM 10 MG PO TABS: 10.0000 mg | ORAL_TABLET | Freq: Every day | ORAL | 3 refills | Status: DC

## 2021-07-20 ENCOUNTER — Encounter: Payer: Self-pay | Admitting: Medical-Surgical

## 2021-08-11 ENCOUNTER — Ambulatory Visit: Payer: 59 | Admitting: Sports Medicine

## 2021-08-13 ENCOUNTER — Ambulatory Visit (INDEPENDENT_AMBULATORY_CARE_PROVIDER_SITE_OTHER): Payer: 59 | Admitting: Sports Medicine

## 2021-08-13 ENCOUNTER — Other Ambulatory Visit: Payer: Self-pay

## 2021-08-13 ENCOUNTER — Ambulatory Visit (INDEPENDENT_AMBULATORY_CARE_PROVIDER_SITE_OTHER): Payer: 59

## 2021-08-13 DIAGNOSIS — M1611 Unilateral primary osteoarthritis, right hip: Secondary | ICD-10-CM

## 2021-08-13 HISTORY — DX: Unilateral primary osteoarthritis, right hip: M16.11

## 2021-08-13 NOTE — Assessment & Plan Note (Signed)
This is a pleasant 60 year old male, he has a long history of pain in the right groin, some popping, moderate gelling. Loss of internal rotation with reproduction of pain. X-rays do show some hip osteoarthritis. Celebrex has been insufficiently efficacious, adding home conditioning exercises, we did an ultrasound-guided hip joint injection today, return to see me in 6 weeks.

## 2021-08-13 NOTE — Progress Notes (Addendum)
° ° °  Procedures performed today:    Procedure: Real-time Ultrasound Guided injection of the right hip joint Device: Samsung HS60  Verbal informed consent obtained.  Time-out conducted.  Noted no overlying erythema, induration, or other signs of local infection.  Skin prepped in a sterile fashion.  Local anesthesia: Topical Ethyl chloride.  With sterile technique and under real time ultrasound guidance: Noted mild joint effusion and femoroacetabular spurring, 22-gauge spinal needle advanced to the femoral head/neck junction, contacted bone and then injected 1 cc Kenalog 40, 2 cc lidocaine, 2 cc bupivacaine easily Completed without difficulty  Advised to call if fevers/chills, erythema, induration, drainage, or persistent bleeding.  Images permanently stored and available for review in PACS.  Impression: Technically successful ultrasound guided injection.  Independent interpretation of notes and tests performed by another provider:   I personally reviewed the hip x-rays, there is subchondral sclerosis and loss of joint space in the right hip joint.  Brief History, Exam, Impression, and Recommendations:    Primary osteoarthritis of right hip This is a pleasant 60 year old male, he has a long history of pain in the right groin, some popping, moderate gelling. Loss of internal rotation with reproduction of pain. X-rays do show some hip osteoarthritis. Celebrex has been insufficiently efficacious, adding home conditioning exercises, we did an ultrasound-guided hip joint injection today, return to see me in 6 weeks.  Chronic process with exacerbation and pharmacologic intervention  ___________________________________________ Ihor Austin. Benjamin Stain, M.D., ABFM., CAQSM. Primary Care and Sports Medicine [facility]  Adjunct Instructor of Family Medicine  [employer]

## 2021-08-26 ENCOUNTER — Encounter: Payer: Self-pay | Admitting: Medical-Surgical

## 2021-08-26 ENCOUNTER — Encounter: Payer: Self-pay | Admitting: Emergency Medicine

## 2021-08-26 ENCOUNTER — Emergency Department: Admit: 2021-08-26 | Discharge status: Absent without leave | Payer: Self-pay

## 2021-08-26 ENCOUNTER — Emergency Department (INDEPENDENT_AMBULATORY_CARE_PROVIDER_SITE_OTHER): Payer: Self-pay

## 2021-08-26 ENCOUNTER — Emergency Department (INDEPENDENT_AMBULATORY_CARE_PROVIDER_SITE_OTHER)
Admission: EM | Admit: 2021-08-26 | Discharge: 2021-08-26 | Disposition: A | Discharge status: Home / Self Care | Payer: Self-pay | Source: Home / Self Care | Attending: Family Medicine | Admitting: Family Medicine

## 2021-08-26 DIAGNOSIS — I824Y1 Acute embolism and thrombosis of unspecified deep veins of right proximal lower extremity: Secondary | ICD-10-CM

## 2021-08-26 DIAGNOSIS — M7989 Other specified soft tissue disorders: Secondary | ICD-10-CM

## 2021-08-26 DIAGNOSIS — M79661 Pain in right lower leg: Secondary | ICD-10-CM

## 2021-08-26 HISTORY — DX: Type 2 diabetes mellitus without complications: E11.9

## 2021-08-26 HISTORY — DX: Essential (primary) hypertension: I10

## 2021-08-26 NOTE — ED Provider Notes (Signed)
[facility]    CSN: [id number] Arrival date & time: 08/26/21  1006      History   Chief Complaint Chief Complaint  Patient presents with   Leg Pain    RLE     HPI Spencer Bishop is a 61 y.o. male.   HPI  Patient has developed pain in the right knee area since last Thursday.  This is the sixth day.  It is getting worse.  His calf is swollen and his ankle is swollen.  He has pain with ambulation.  He did not feel able to go to work today so he came in today. Patient is a cigarette smoker Patient has no history of blood clots  Past Medical History:  Diagnosis Date   Alcohol use    Diabetes mellitus without complication ([place])    Hypertension     Patient Active Problem List   Diagnosis Date Noted   Primary osteoarthritis of right hip 08/13/2021   Type 2 diabetes mellitus with hyperglycemia, without long-term current use of insulin ([place]) 07/03/2021   Status post cataract extraction and insertion of intraocular lens 01/21/2015   Current every day smoker 01/08/2015   Snores 01/08/2015   SHOULDER PAIN, RIGHT 06/29/2007   HAND PAIN, RIGHT 06/29/2007    Past Surgical History:  Procedure Laterality Date   RECONSTRUCTION OF NOSE         Home Medications    Prior to Admission medications   Medication Sig Start Date End Date Taking? Authorizing Provider  albuterol (VENTOLIN HFA) 108 (90 Base) MCG/ACT inhaler Inhale 2 puffs into the lungs every 6 (six) hours as needed for wheezing or cough. 12/27/14   [provider]  blood glucose meter kit and supplies KIT Dispense based on patient and insurance preference. Use up to four times daily as directed. Please include lancets, test strips, control solution. 11/18/20   Samuel Bouche, NP  celecoxib (CELEBREX) 200 MG capsule One to 2 tablets by mouth daily as needed for pain. 07/03/21   Samuel Bouche, NP  glucose blood (FREESTYLE LITE) test strip Dx DM E11.9 Check fasting blood sugar daily and 2 hours after largest  meal 2 times a week. 12/16/20   Samuel Bouche, NP  Lancets (FREESTYLE) lancets Dx DM E11.9 Check fasting blood sugar daily and 2 hours after largest meal 2 times a week. 12/16/20   Samuel Bouche, NP  metFORMIN (GLUCOPHAGE XR) 500 MG 24 hr tablet Take 1 tablet (500 mg total) by mouth daily with breakfast for 7 days, THEN 1 tablet (500 mg total) in the morning and at bedtime. 11/18/20 11/18/21  Samuel Bouche, NP  rosuvastatin (CRESTOR) 10 MG tablet Take 1 tablet (10 mg total) by mouth daily. 07/06/21   Samuel Bouche, NP  valsartan (DIOVAN) 40 MG tablet Take 1 tablet (40 mg total) by mouth daily. 12/02/20   Samuel Bouche, NP    Family History Family History  Problem Relation Age of Onset   Heart attack Mother    Diabetes Mother    Heart attack Father     Social History Social History   Tobacco Use   Smoking status: Every Day    Packs/day: 1.50    Types: Cigarettes   Smokeless tobacco: Never  Vaping Use   Vaping Use: Never used  Substance Use Topics   Alcohol use: Yes    Alcohol/week: 30.0 standard drinks    Types: 30 Standard drinks or equivalent per week   Drug use: Yes  Frequency: 7.0 times per week    Types: Marijuana    Comment: daily use     Allergies   Tylenol [acetaminophen]   Review of Systems Review of Systems   Physical Exam Triage Vital Signs ED Triage Vitals  Enc Vitals Group     BP 08/26/21 1116 (!) 152/92     Pulse Rate 08/26/21 1116 70     Resp 08/26/21 1116 18     Temp 08/26/21 1116 98.4 F (36.9 C)     Temp Source 08/26/21 1116 Oral     SpO2 08/26/21 1116 98 %     Weight 08/26/21 1118 200 lb (90.7 kg)     Height 08/26/21 1118 5' 8.25" (1.734 m)     Head Circumference --      Peak Flow --      Pain Score 08/26/21 1117 8     Pain Loc --      Pain Edu? --      Excl. in [place]? --    No data found.  Updated Vital Signs BP (!) 152/92 (BP Location: Left Arm)    Pulse 70    Temp 98.4 F (36.9 C) (Oral)    Resp 18    Ht 5' 8.25" (1.734 m)    Wt 90.7 kg    SpO2 98%     BMI 30.19 kg/m       Physical Exam Constitutional:      General: He is not in acute distress.    Appearance: He is well-developed.  HENT:     Head: Normocephalic and atraumatic.     Mouth/Throat:     Comments: Mask is in place Eyes:     Conjunctiva/sclera: Conjunctivae normal.     Pupils: Pupils are equal, round, and reactive to light.  Cardiovascular:     Rate and Rhythm: Normal rate.  Pulmonary:     Effort: Pulmonary effort is normal. No respiratory distress.  Abdominal:     General: There is no distension.     Palpations: Abdomen is soft.  Musculoskeletal:        General: Normal range of motion.     Cervical back: Normal range of motion.     Comments: Right knee joint is normal.  Right leg is swollen as opposed to the left with faint erythema.  There is tenderness deep in the calf.  There is pain with dorsiflexion of foot. No palpable cord. Knee joint symmetric on both legs with no warmth or effusion crepitus instability  Skin:    General: Skin is warm and dry.  Neurological:     Mental Status: He is alert.     UC Treatments / Results  Labs (all labs ordered are listed, but only abnormal results are displayed) Labs Reviewed - No data to display  EKG   Radiology US Venous Img Lower Unilateral Right  Result Date: 08/26/2021 CLINICAL DATA:  61 year old male with right lower extremity pain and swelling. EXAM: RIGHT LOWER EXTREMITY VENOUS DOPPLER ULTRASOUND TECHNIQUE: Gray-scale sonography with graded compression, as well as color Doppler and duplex ultrasound were performed to evaluate the right lower extremity deep venous systems from the level of the common femoral vein and including the common femoral, femoral, profunda femoral, popliteal and calf veins including the posterior tibial, peroneal and gastrocnemius veins when visible. Spectral Doppler was utilized to evaluate flow at rest and with distal augmentation maneuvers in the common femoral, femoral and  popliteal veins. The contralateral common femoral vein was also  evaluated for comparison. COMPARISON:  None. FINDINGS: RIGHT LOWER EXTREMITY Common Femoral Vein: No evidence of thrombus. Normal compressibility, respiratory phasicity and response to augmentation. Central Greater Saphenous Vein: Nonocclusive, expansile, mildly echogenic thrombus present. Central Profunda Femoral Vein: Nonocclusive, expansile, mildly echogenic thrombus present. Femoral Vein: Occlusive, expansile, hypoechoic thrombus throughout. Popliteal Vein: Occlusive, expansile, hypoechoic thrombus throughout. Calf Veins: Occlusive, slightly echogenic thrombus throughout. Other Findings:  None. LEFT LOWER EXTREMITY Common Femoral Vein: No evidence of thrombus. Normal compressibility, respiratory phasicity and response to augmentation. IMPRESSION: Acute deep vein thrombosis extending from the calf veins to the level of the saphenofemoral junction. The common femoral vein is patent. These results were called by telephone at the time of interpretation on 08/26/2021 at 12:38 pm to provider DR. Lysle Morales , who verbally acknowledged these results. Ruthann Cancer, MD [facility] Radiology Electronically Signed   By: Ruthann Cancer M.D.   On: 08/26/2021 12:39    Procedures Procedures (including critical care time)  Medications Ordered in UC Medications - No data to display  Initial Impression / Assessment and Plan / UC Course  I have reviewed the triage vital signs and the nursing notes.  Pertinent labs & imaging results that were available during my care of the patient were reviewed by me and considered in my medical decision making (see chart for details).    Discussed test result with  patient and wife Discussed test results with Dr. Samuel Bouche Will refer to emergency room for IV heparin Final Clinical Impressions(s) / UC Diagnoses   Final diagnoses:  DVT, lower extremity, proximal, acute,  right [facility])     Discharge Instructions      Go directly to the emergency room     ED Prescriptions   None    PDMP not reviewed this encounter.   Raylene Everts, MD 08/26/21 1359

## 2021-08-26 NOTE — Discharge Instructions (Signed)
Go directly to the emergency room

## 2021-08-26 NOTE — ED Notes (Signed)
Patient is being discharged from the Urgent Care and sent to the Emergency Department via POV w/ wife  . Per Dr Orlean Patten , patient is in need of higher level of care due to need for heparin gtt. Patient  & wife are aware and verbalizes understanding of plan of care.  Vitals:   08/26/21 1116  BP: (!) 152/92  Pulse: 70  Resp: 18  Temp: 98.4 F (36.9 C)  SpO2: 98%   Report called by Dr Meda Coffee to ED triage nurse at [facility]

## 2021-08-26 NOTE — ED Triage Notes (Addendum)
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

## 2021-08-27 ENCOUNTER — Ambulatory Visit: Payer: Self-pay | Admitting: Sports Medicine

## 2021-08-31 ENCOUNTER — Encounter: Payer: Self-pay | Admitting: Medical-Surgical

## 2021-08-31 ENCOUNTER — Ambulatory Visit (INDEPENDENT_AMBULATORY_CARE_PROVIDER_SITE_OTHER): Payer: Managed Care, Other (non HMO) | Admitting: Medical-Surgical

## 2021-08-31 ENCOUNTER — Other Ambulatory Visit: Payer: Self-pay

## 2021-08-31 VITALS — BP 139/82 | HR 77 | Resp 20 | Ht 68.0 in | Wt 193.0 lb

## 2021-08-31 DIAGNOSIS — I824Y1 Acute embolism and thrombosis of unspecified deep veins of right proximal lower extremity: Secondary | ICD-10-CM

## 2021-08-31 DIAGNOSIS — I824Z1 Acute embolism and thrombosis of unspecified deep veins of right distal lower extremity: Secondary | ICD-10-CM | POA: Diagnosis not present

## 2021-08-31 MED ORDER — OXYCODONE HCL 5 MG PO TABS: 5.0000 mg | ORAL_TABLET | Freq: Four times a day (QID) | ORAL | 0 refills | Status: DC | PRN

## 2021-08-31 MED ORDER — APIXABAN 5 MG PO TABS: 5.0000 mg | ORAL_TABLET | Freq: Two times a day (BID) | ORAL | 3 refills | Status: DC

## 2021-08-31 NOTE — Progress Notes (Signed)
Established patient office visit  HPI with pertinent ROS:   CC: DVT/Hospital discharge follow-up  HPI: Pleasant 61 year old male accompanied by his wife presenting today after being discharged from the emergency room with a diagnosis of DVT.  He was originally seen in urgent care on 08/26/2021.  At that time, an ultrasound was performed showing a very large blood clot in the right leg extending from nearly hip to ankle.  He was sent to the emergency room for consideration of treatment with a heparin drip due to the size of the clot.  At the emergency room, the patient reports that no exam was performed on his leg.  Instead they gave him a starter dose of Xarelto and sent him home with instructions to follow-up closely with his PCP.  Today he presents with reports of taking Xarelto twice daily as prescribed for the starter pack.  He was assisted by his pharmacist with the 1 month free card so there was no delay in care.  Notes that the swelling in his leg has decreased quite a bit although it is still not fully resolved.  He still has significant pain in the right leg, mainly behind the knee and in the calf.  This improves with elevation and rest but he notes he gets very restless and frequently gets up to move around.  After a few minutes on his feet, he has to return to seated with his leg elevated due to pain.  He was prescribed oxycodone at the emergency room as he cannot take Tylenol and NSAIDs are contraindicated while taking blood thinners.  He has been taking this sparingly and averages around 2 doses per day.  No issues with bleeding.  Denies chest pain, shortness of breath, palpitations, dizziness, headaches, or syncopal episodes.  He is a smoker but has no other known risk factors or potential provoking factors.  I reviewed the past medical history, family history, social history, surgical history, and allergies today and no changes were needed.  Please see the problem list section below in  epic for further details.   Brief exam, Assessment, and Plan:   Today's Vitals: BP 139/82 (BP Location: Left Arm, Patient Position: Sitting, Cuff Size: Large)    Pulse 77    Resp 20    Ht 5' 8"  (1.727 m)    Wt 193 lb (87.5 kg)    SpO2 98%    BMI 29.35 kg/m   1. DVT, lower extremity, proximal, acute, right ([place]) 2. DVT, lower extremity, distal, acute, right ([place]) On exam, right lower extremity edema has greatly improved however there is still some lower leg tightness.  Tenderness along the calf extending up to the back of the knee.  Skin warm, dry, normal capillary refill.  Gait irregular, favoring due to pain.  Continue Xarelto twice daily as prescribed.  New prescription for oxycodone 5 mg every 6 hours as needed with instructions to use sparingly.  Recommend rest with elevation at this point although it is okay to get up and move around throughout the day and short burst.  Would recommend he contact his employer regarding FMLA and short-term disability.  For now we will plan to extend his time out from work through the end of this month.  Discussed the nature of DVTs, risk factors, and emergency signs to monitor for.  Patient and wife both verbalized understanding of the plan.  Due to financial costs and insurance concerns, we will plan to switch at the end of the 30-day  period to Eliquis 5 mg twice daily.  Sending this in to allow insurance requirements to be met without a break in therapy.  Patient and wife aware to avoid taking the Eliquis until the Xarelto has been completed.  Return in about 1 month (around 10/01/2021) for DVT follow up. ___________________________________________ Clearnce Sorrel, DNP, APRN, FNP-BC Primary Care and [place]

## 2021-09-14 ENCOUNTER — Encounter: Payer: Self-pay | Admitting: Medical-Surgical

## 2021-09-14 ENCOUNTER — Telehealth: Payer: Self-pay

## 2021-09-14 NOTE — Telephone Encounter (Signed)
Medication: apixaban (ELIQUIS) 5 MG TABS tablet Prior authorization determination received Medication has been approved Approval dates: 09/14/2021-09/14/2022  Patient aware via: MyChart Pharmacy aware: Yes Provider aware via this encounter

## 2021-09-14 NOTE — Telephone Encounter (Signed)
Medication: apixaban (ELIQUIS) 5 MG TABS tablet Prior authorization submitted via CoverMyMeds on 09/14/2021 [place] submission pending

## 2021-09-15 ENCOUNTER — Other Ambulatory Visit: Payer: Self-pay | Admitting: Medical-Surgical

## 2021-09-15 ENCOUNTER — Encounter: Payer: Self-pay | Admitting: Medical-Surgical

## 2021-09-15 DIAGNOSIS — I824Z1 Acute embolism and thrombosis of unspecified deep veins of right distal lower extremity: Secondary | ICD-10-CM

## 2021-09-15 DIAGNOSIS — I824Y1 Acute embolism and thrombosis of unspecified deep veins of right proximal lower extremity: Secondary | ICD-10-CM

## 2021-09-15 MED ORDER — OXYCODONE HCL 5 MG PO TABS: 5.0000 mg | ORAL_TABLET | Freq: Four times a day (QID) | ORAL | 0 refills | Status: DC | PRN

## 2021-09-15 NOTE — Telephone Encounter (Signed)
Patient advised.

## 2021-09-15 NOTE — Telephone Encounter (Signed)
Long term treatment with narcotics is not recommended. After this refill is done, will need to discuss non-narcotic pain control options.

## 2021-09-16 ENCOUNTER — Encounter: Payer: Self-pay | Admitting: Medical-Surgical

## 2021-09-21 NOTE — Progress Notes (Signed)
VASCULAR AND VEIN SPECIALISTS OF [place]  ASSESSMENT / PLAN: 61 y.o. male with right lower extremity acute deep vein thrombosis extending from the calf veins to the level of the saphenofemoral junction.  Recommend a minimum of three months of anticoagulation.  Patient counseled to update all cancer screening.  Patient counseled regarding the limited benefit for intervention on thrombus below the common femoral artery.  Follow up with me as needed.  CHIEF COMPLAINT: right leg pain  HISTORY OF PRESENT ILLNESS: Spencer Bishop is a 61 y.o. male who presented to [facility] ER for right leg pain. 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 He is typically active. He has no personal or family history of clotting. He has never had a DVT before. He was appropriately started on Xarelto. This is too expensive, and he is transitioning to Eliquis. He is not up to date on cancer screening like colonoscopy or low dose chest CT.  Past Medical History:  Diagnosis Date   Alcohol use    Diabetes mellitus without complication ([place])    DVT (deep venous thrombosis) (HCC)    Hypertension     Past Surgical History:  Procedure Laterality Date   RECONSTRUCTION OF NOSE      Family History  Problem Relation Age of Onset   Heart attack Mother    Diabetes Mother    Heart attack Father     Social History   Socioeconomic History   Marital status: Married    Spouse name: Not on file   Number of children: Not on file   Years of education: Not on file   Highest education level: Not on file  Occupational History   Not on file  Tobacco Use   Smoking status: Every Day    Packs/day: 1.50    Types: Cigarettes   Smokeless tobacco: Never  Vaping Use   Vaping Use: Never used  Substance and Sexual Activity   Alcohol use: Yes    Alcohol/week: 30.0 standard drinks    Types: 30 Standard drinks or equivalent per week   Drug use: Yes    Frequency:  7.0 times per week    Types: Marijuana    Comment: daily use   Sexual activity: Yes    Partners: Female    Birth control/protection: Surgical  Other Topics Concern   Not on file  Social History Narrative   Not on file   Social Determinants of Health   Financial Resource Strain: Not on file  Food Insecurity: Not on file  Transportation Needs: Not on file  Physical Activity: Not on file  Stress: Not on file  Social Connections: Not on file  Intimate Partner Violence: Not on file    Allergies  Allergen Reactions   Tylenol [Acetaminophen] Itching    Current Outpatient Medications  Medication Sig Dispense Refill   albuterol (VENTOLIN HFA) 108 (90 Base) MCG/ACT inhaler Inhale 2 puffs into the lungs every 6 (six) hours as needed for wheezing or cough.     blood glucose meter kit and supplies KIT Dispense based on patient and insurance preference. Use up to four times daily as directed. Please include lancets, test strips, control solution. 1 each 0   glucose blood (FREESTYLE LITE) test strip Dx DM E11.9 Check fasting blood sugar daily and 2 hours after largest meal 2 times a week. 100 each 12   Lancets (FREESTYLE) lancets Dx DM E11.9 Check fasting blood sugar daily and  2 hours after largest meal 2 times a week. 100 each 12   metFORMIN (GLUCOPHAGE XR) 500 MG 24 hr tablet Take 1 tablet (500 mg total) by mouth daily with breakfast for 7 days, THEN 1 tablet (500 mg total) in the morning and at bedtime. 60 tablet 11   oxyCODONE (OXY IR/ROXICODONE) 5 MG immediate release tablet Take 1 tablet (5 mg total) by mouth every 6 (six) hours as needed for severe pain. 10 tablet 0   rosuvastatin (CRESTOR) 10 MG tablet Take 1 tablet (10 mg total) by mouth daily. 90 tablet 3   valsartan (DIOVAN) 40 MG tablet Take 1 tablet (40 mg total) by mouth daily. 90 tablet 3   XARELTO STARTER PACK Take by mouth per instructions on pack. Take $RemoveBe'15mg'JxptedyUz$  twice daily with food for the first 21 days, then $RemoveBe'20mg'cPGMvbkyz$  once daily with  food for Days 22-30. Start taking 20 mg a day only after the 15 mg twice a day tabs are gone. Do not take both together     apixaban (ELIQUIS) 5 MG TABS tablet Take 1 tablet (5 mg total) by mouth 2 (two) times daily. (Patient not taking: Reported on 09/22/2021) 60 tablet 3   No current facility-administered medications for this visit.    REVIEW OF SYSTEMS:  $RemoveB'[X]'laAHSqyF$  denotes positive finding, $RemoveBeforeDEI'[ ]'JvvRRrFRDfbAxEPb$  denotes negative finding Cardiac  Comments:  Chest pain or chest pressure:    Shortness of breath upon exertion:    Short of breath when lying flat:    Irregular heart rhythm:        Vascular    Pain in calf, thigh, or hip brought on by ambulation:    Pain in feet at night that wakes you up from your sleep:     Blood clot in your veins:    Leg swelling:         Pulmonary    Oxygen at home:    Productive cough:     Wheezing:         Neurologic    Sudden weakness in arms or legs:     Sudden numbness in arms or legs:     Sudden onset of difficulty speaking or slurred speech:    Temporary loss of vision in one eye:     Problems with dizziness:         Gastrointestinal    Blood in stool:     Vomited blood:         Genitourinary    Burning when urinating:     Blood in urine:        Psychiatric    Major depression:         Hematologic    Bleeding problems:    Problems with blood clotting too easily:        Skin    Rashes or ulcers:        Constitutional    Fever or chills:      PHYSICAL EXAM Vitals:   09/22/21 0932  BP: 128/83  Pulse: (!) 109  Resp: 20  Temp: 99 F (37.2 C)  SpO2: 99%  Weight: 193 lb (87.5 kg)  Height: $Remove'5\' 8"'iujbjoj$  (1.727 m)   Constitutional: well appearing in no distress. Appears well nourished.  Neurologic: CN intact. No focal findings. No sensory loss. Psychiatric: Mood and affect symmetric and appropriate. Eyes: No icterus. No conjunctival pallor. Ears, nose, throat: mucous membranes moist. Midline trachea.  Cardiac: regular rate and rhythm.   Respiratory: unlabored. Abdominal: soft, non-tender, non-distended.  Peripheral vascular:  1+ DP pulses Extremity: No edema. No cyanosis. No pallor.  Skin: No gangrene. No ulceration.  Lymphatic: No Stemmer's sign. No palpable lymphadenopathy.   PERTINENT LABORATORY AND RADIOLOGIC DATA  Most recent CBC CBC Latest Ref Rng & Units 07/03/2021 11/14/2020  WBC 3.8 - 10.8 Thousand/uL 6.9 6.9  Hemoglobin 13.2 - 17.1 g/dL 16.9 16.7  Hematocrit 38.5 - 50.0 % 48.0 49.5  Platelets 140 - 400 Thousand/uL 165 136(L)     Most recent CMP CMP Latest Ref Rng & Units 07/03/2021 11/14/2020 07/12/2007  Glucose 65 - 139 mg/dL 89 266(H) 81  BUN 7 - 25 mg/dL _0 Creatinine 0.70 - 1.30 mg/dL 0.88 0.89 0.96  Sodium 135 - 146 mmol/L 141 134(L) 139  Potassium 3.5 - 5.3 mmol/L 4.4 4.5 4.0  Chloride 98 - 110 mmol/L 105 99 105  CO2 20 - 32 mmol/L _1 Calcium 8.6 - 10.3 mg/dL 10.2 9.6 9.6  Total Protein 6.1 - 8.1 g/dL 6.8 6.9 7.5  Total Bilirubin 0.2 - 1.2 mg/dL 0.4 0.4 0.7  Alkaline Phos 39 - 117 units/L - - 99  AST 10 - 35 U/L _2 ALT 9 - 46 U/L _3 Renal function CrCl cannot be calculated (Patient's most recent lab result is older than the maximum 21 days allowed.).  HbA1c, POC (controlled diabetic range) (%)  Date Value  07/03/2021 5.4    LDL Cholesterol (Calc)  Date Value Ref Range Status  07/03/2021 99 mg/dL (calc) Final    Comment:    Reference range: <100 . Desirable range <100 mg/dL for primary prevention;   <70 mg/dL for patients with CHD or diabetic patients  with > or = 2 CHD risk factors. Marland Kitchen LDL-C is now calculated using the Martin-Hopkins  calculation, which is a validated novel method providing  better accuracy than the Friedewald equation in the  estimation of LDL-C.  Cresenciano Genre et al. Annamaria Helling. 9941;290(47): 2061-2068  (http://education.[employer].com/faq/FAQ164)     Right femoropopliteal and calf vein DVT on deep venous duplex 08/26/21.    Yevonne Aline. Stanford Breed, MD [facility] of [facility] Phone Number: [phone] 09/22/2021 11:57 AM  Total time spent on preparing this encounter including chart review, data review, collecting history, examining the patient, coordinating care for this new patient, 45 minutes.  Portions of this report may have been transcribed using voice recognition software.  Every effort has been made to ensure accuracy; however, inadvertent computerized transcription errors may still be present.

## 2021-09-22 ENCOUNTER — Ambulatory Visit: Payer: Managed Care, Other (non HMO) | Admitting: Vascular Surgery

## 2021-09-22 ENCOUNTER — Encounter: Payer: Self-pay | Admitting: Vascular Surgery

## 2021-09-22 ENCOUNTER — Other Ambulatory Visit: Payer: Self-pay

## 2021-09-22 VITALS — BP 128/83 | HR 109 | Temp 99.0°F | Resp 20 | Ht 68.0 in | Wt 193.0 lb

## 2021-09-22 DIAGNOSIS — I82411 Acute embolism and thrombosis of right femoral vein: Secondary | ICD-10-CM

## 2021-09-24 ENCOUNTER — Ambulatory Visit: Payer: 59 | Admitting: Sports Medicine

## 2021-10-01 ENCOUNTER — Other Ambulatory Visit: Payer: Self-pay

## 2021-10-01 ENCOUNTER — Encounter: Payer: Self-pay | Admitting: Medical-Surgical

## 2021-10-01 ENCOUNTER — Ambulatory Visit (INDEPENDENT_AMBULATORY_CARE_PROVIDER_SITE_OTHER): Payer: Managed Care, Other (non HMO) | Admitting: Medical-Surgical

## 2021-10-01 VITALS — BP 142/79 | HR 69 | Wt 191.1 lb

## 2021-10-01 DIAGNOSIS — I10 Essential (primary) hypertension: Secondary | ICD-10-CM | POA: Diagnosis not present

## 2021-10-01 DIAGNOSIS — I824Z1 Acute embolism and thrombosis of unspecified deep veins of right distal lower extremity: Secondary | ICD-10-CM | POA: Diagnosis not present

## 2021-10-01 DIAGNOSIS — Z122 Encounter for screening for malignant neoplasm of respiratory organs: Secondary | ICD-10-CM

## 2021-10-01 DIAGNOSIS — I824Y1 Acute embolism and thrombosis of unspecified deep veins of right proximal lower extremity: Secondary | ICD-10-CM

## 2021-10-01 DIAGNOSIS — Z7901 Long term (current) use of anticoagulants: Secondary | ICD-10-CM | POA: Diagnosis not present

## 2021-10-01 NOTE — Progress Notes (Signed)
°  HPI with pertinent ROS:   CC: DVT follow up  HPI: Pleasant 61 year old male accompanied by his wife presenting today for follow up on his right leg DVT. He was able to get in with the Vein Specialist who recommended updating cancer screening along with continuing anticoagulation for no less than 3 months. He is currently taking Eliquis after switching from Xarelto due to the cost. Tolerating the medication well without side effects. Still having pain in the right leg but it is mostly centered along the back of the knee. He is not taking pain medication at this point although he did try one of his wife's Tramadol when it was hurting really bad a few days ago. Remains out of work on short-term disability. Wondering if he may benefit from compression stockings or socks when he returns to work.   I reviewed the past medical history, family history, social history, surgical history, and allergies today and no changes were needed.  Please see the problem list section below in epic for further details.   Physical exam:   General: Well Developed, well nourished, and in no acute distress.  Neuro: Alert and oriented x3.  HEENT: Normocephalic, atraumatic,.  Skin: Warm and dry. Cardiac: Regular rate and rhythm, no murmurs rubs or gallops, no lower extremity edema.  Respiratory: Clear to auscultation bilaterally. Not using accessory muscles, speaking in full sentences.  Impression and Recommendations:    1. DVT, lower extremity, distal, acute, right (HCC) 2. DVT, lower extremity, proximal, acute, right (HCC) 3. On anticoagulant therapy Continue Eliquis as instructed. Discussed topical pain management options including Lidocaine patches.   4. Essential hypertension, benign BP slightly elevated on arrival. For now continue Valsartan 40mg  daily. Check BP at home with goal of 130/80 or less. If consistently higher, recommend increasing Valsartan to 80mg  daily. Recommend low sodium diet.   5. Encounter  for screening for lung cancer Long term smoker of 1.5 ppd with unprovoked large DVT. Ordering CT chest for lung cancer screening. Would likely benefit from screenings for colon cancer but will hold off on this due to current need for anticoagulation.  - CT CHEST LUNG CANCER SCREENING LOW DOSE WO CONTRAST; Future  Return in about 2 months (around 11/29/2021) for DVT/anticoagulation follow up. ___________________________________________ , DNP, APRN, FNP-BC Primary Care and Sports Medicine [facility] [place]

## 2021-10-05 ENCOUNTER — Ambulatory Visit (INDEPENDENT_AMBULATORY_CARE_PROVIDER_SITE_OTHER): Payer: Managed Care, Other (non HMO) | Admitting: Sports Medicine

## 2021-10-05 ENCOUNTER — Other Ambulatory Visit: Payer: Self-pay

## 2021-10-05 DIAGNOSIS — M1611 Unilateral primary osteoarthritis, right hip: Secondary | ICD-10-CM | POA: Diagnosis not present

## 2021-10-05 MED ORDER — TRAMADOL HCL 50 MG PO TABS: 50.0000 mg | ORAL_TABLET | Freq: Two times a day (BID) | ORAL | 3 refills | Status: DC

## 2021-10-05 NOTE — Assessment & Plan Note (Signed)
Spencer Bishop returns, he is a very pleasant 61 year old male, long history of right groin pain, popping, moderate gelling, loss of internal rotation, at the end of December we injected his right hip joint, he did really well, he had drastic improvements in his pain but unfortunately his pain has returned, worse with internal rotation of the hip. We can no longer do NSAIDs as he had a DVT along the way and is currently on Eliquis. We will do tramadol 50 mg twice daily, he can call me if he needs to use this more often. Ultimate goal is of course to keep him out of the operating room for hip replacement. In addition I have recommended he get lower extremity compression stockings as he does have 2+ pitting edema on the right as well as continues to have a positive Denna Haggard' sign. He does have some oxycodone that he will keep in his medicine cabinet for breakthrough pain.

## 2021-10-05 NOTE — Progress Notes (Signed)
° ° °  Procedures performed today:    None.  Independent interpretation of notes and tests performed by another provider:   None.  Brief History, Exam, Impression, and Recommendations:    Primary osteoarthritis of right hip Spencer Bishop returns, he is a very pleasant 61 year old male, long history of right groin pain, popping, moderate gelling, loss of internal rotation, at the end of December we injected his right hip joint, he did really well, he had drastic improvements in his pain but unfortunately his pain has returned, worse with internal rotation of the hip. We can no longer do NSAIDs as he had a DVT along the way and is currently on Eliquis. We will do tramadol 50 mg twice daily, he can call me if he needs to use this more often. Ultimate goal is of course to keep him out of the operating room for hip replacement. In addition I have recommended he get lower extremity compression stockings as he does have 2+ pitting edema on the right as well as continues to have a positive Denna Haggard' sign. He does have some oxycodone that he will keep in his medicine cabinet for breakthrough pain.  Chronic process with exacerbation and pharmacologic intervention  ___________________________________________ Spencer Bishop. Benjamin Stain, M.D., ABFM., CAQSM. Primary Care and Sports Medicine [facility]  Adjunct Instructor of Family Medicine  [employer]

## 2021-10-12 ENCOUNTER — Telehealth: Payer: Self-pay

## 2021-10-12 NOTE — Telephone Encounter (Signed)
Spencer Bishop [facility]   [phone]  Claim # [id number]  Approved  Authorization # [id number]  [facility] for Lung Cancer Screening  CPT 71271  1 Unit  Approved for 10/07/2021 - 04/05/2022

## 2021-10-19 ENCOUNTER — Encounter: Payer: Self-pay | Admitting: Medical-Surgical

## 2021-10-19 MED ORDER — VALSARTAN 40 MG PO TABS: 40.0000 mg | ORAL_TABLET | Freq: Every day | ORAL | 1 refills | Status: DC

## 2021-10-30 ENCOUNTER — Other Ambulatory Visit: Payer: Self-pay

## 2021-10-30 ENCOUNTER — Encounter: Payer: Self-pay | Admitting: Medical-Surgical

## 2021-10-30 ENCOUNTER — Ambulatory Visit (INDEPENDENT_AMBULATORY_CARE_PROVIDER_SITE_OTHER): Payer: Managed Care, Other (non HMO) | Admitting: Medical-Surgical

## 2021-10-30 VITALS — BP 157/95 | HR 70 | Resp 20 | Ht 68.0 in | Wt 191.4 lb

## 2021-10-30 DIAGNOSIS — Z7901 Long term (current) use of anticoagulants: Secondary | ICD-10-CM

## 2021-10-30 DIAGNOSIS — I824Y1 Acute embolism and thrombosis of unspecified deep veins of right proximal lower extremity: Secondary | ICD-10-CM

## 2021-10-30 DIAGNOSIS — I824Z1 Acute embolism and thrombosis of unspecified deep veins of right distal lower extremity: Secondary | ICD-10-CM

## 2021-10-30 DIAGNOSIS — I10 Essential (primary) hypertension: Secondary | ICD-10-CM

## 2021-10-30 NOTE — Progress Notes (Signed)
?  HPI with pertinent ROS:  ? ?CC: Blood clot follow-up ? ?HPI: ?Pleasant 61 year old male presenting today for follow-up on his right leg blood clot.  He is still taking Eliquis 5 mg twice daily as prescribed, tolerating well without side effects.  Notes that he does still have some tenderness and pain in the right leg, along the lateral shin at times but most often in the posterior knee.  He has been out of work since the middle of January due to the issue with this blood clot and is scheduled to return to work week after next.  He notes his wife is significantly concerned regarding his ability to go back to work and scheduled this appointment for him.  He notes that he does have some concerns himself however he knows he has to go back to work at some point as he prefers to stay busy as well as he is aware of financial needs for the family.  He does not have compression socks or stockings but they have been ordered.  ? ?Ran out of his blood pressure medicine and was off of it for 4 days while trying to get insurance approval.  He did restart this yesterday.  Notes that his blood pressure was okay on the top but still high on the bottom yesterday. ? ?I reviewed the past medical history, family history, social history, surgical history, and allergies today and no changes were needed.  Please see the problem list section below in epic for further details. ? ?Physical exam:  ? ?General: Well Developed, well nourished, and in no acute distress.  ?Neuro: Alert and oriented x3.  ?HEENT: Normocephalic, atraumatic.  ?Skin: Warm and dry. ?Cardiac: Regular rate and rhythm.  ?Respiratory: Not using accessory muscles, speaking in full sentences. ? ?Impression and Recommendations:   ? ?1. DVT, lower extremity, distal, acute, right (HCC) ?2. DVT, lower extremity, proximal, acute, right (HCC) ?3. On anticoagulant therapy ?Continue Eliquis 5 mg twice daily.  Strongly recommend compression socks or stockings as this will likely help  with swelling as well as discomfort.  At this point, he is fairly mobile and doing well in his everyday activities.  I think he would be okay to go back to work but would recommend giving it a trial of short days until he knows his tolerance.  Advised him to contact his employer and see if they are willing to work with him on starting out slowly with a few hours each day and the progressing as tolerated.  He plans to contact them and will let me know if there is more paperwork that has to be done to accommodate this. ? ?4.  Hypertension ?Continue valsartan 40 mg daily as prescribed.  Monitor blood pressure at home with goal of 130/80 or less. ? ?No follow-ups on file. ?___________________________________________ ?Thayer Ohm, DNP, APRN, FNP-BC ?Primary Care and Sports Medicine ?[facility] ?

## 2021-11-26 ENCOUNTER — Other Ambulatory Visit: Payer: Self-pay | Admitting: Medical-Surgical

## 2021-11-26 ENCOUNTER — Encounter: Payer: Self-pay | Admitting: Medical-Surgical

## 2021-11-26 DIAGNOSIS — I824Y1 Acute embolism and thrombosis of unspecified deep veins of right proximal lower extremity: Secondary | ICD-10-CM

## 2021-11-26 DIAGNOSIS — I824Z1 Acute embolism and thrombosis of unspecified deep veins of right distal lower extremity: Secondary | ICD-10-CM

## 2021-11-26 NOTE — Telephone Encounter (Signed)
CT order in Epic but I don't see one to recheck DVT, please advise.  ?

## 2021-11-30 ENCOUNTER — Ambulatory Visit (INDEPENDENT_AMBULATORY_CARE_PROVIDER_SITE_OTHER): Payer: Managed Care, Other (non HMO)

## 2021-11-30 DIAGNOSIS — F17210 Nicotine dependence, cigarettes, uncomplicated: Secondary | ICD-10-CM

## 2021-11-30 DIAGNOSIS — Z122 Encounter for screening for malignant neoplasm of respiratory organs: Secondary | ICD-10-CM | POA: Diagnosis not present

## 2021-11-30 DIAGNOSIS — I824Y1 Acute embolism and thrombosis of unspecified deep veins of right proximal lower extremity: Secondary | ICD-10-CM | POA: Diagnosis not present

## 2021-11-30 DIAGNOSIS — I824Z1 Acute embolism and thrombosis of unspecified deep veins of right distal lower extremity: Secondary | ICD-10-CM | POA: Diagnosis not present

## 2021-12-01 ENCOUNTER — Other Ambulatory Visit: Payer: Self-pay | Admitting: Medical-Surgical

## 2021-12-01 DIAGNOSIS — R911 Solitary pulmonary nodule: Secondary | ICD-10-CM | POA: Insufficient documentation

## 2021-12-01 DIAGNOSIS — J439 Emphysema, unspecified: Secondary | ICD-10-CM

## 2021-12-01 DIAGNOSIS — I700 Atherosclerosis of aorta: Secondary | ICD-10-CM

## 2021-12-01 HISTORY — DX: Emphysema, unspecified: J43.9

## 2021-12-01 HISTORY — DX: Atherosclerosis of aorta: I70.0

## 2021-12-01 HISTORY — DX: Solitary pulmonary nodule: R91.1

## 2021-12-03 LAB — HEPATIC FUNCTION PANEL
AG Ratio: 1.8 (calc) (ref 1.0–2.5)
ALT: 12 U/L (ref 9–46)
AST: 15 U/L (ref 10–35)
Albumin: 4.3 g/dL (ref 3.6–5.1)
Alkaline phosphatase (APISO): 91 U/L (ref 35–144)
Bilirubin, Direct: 0.1 mg/dL (ref 0.0–0.2)
Globulin: 2.4 g/dL (calc) (ref 1.9–3.7)
Indirect Bilirubin: 0.4 mg/dL (calc) (ref 0.2–1.2)
Total Bilirubin: 0.5 mg/dL (ref 0.2–1.2)
Total Protein: 6.7 g/dL (ref 6.1–8.1)

## 2021-12-03 LAB — LIPID PANEL
Cholesterol: 133 mg/dL (ref <200)
HDL: 63 mg/dL (ref >=40)
LDL Cholesterol (Calc): 55 mg/dL (calc)
Non-HDL Cholesterol (Calc): 70 mg/dL (calc) (ref <130)
Total CHOL/HDL Ratio: 2.1 (calc) (ref <5.0)
Triglycerides: 70 mg/dL (ref <150)

## 2021-12-11 ENCOUNTER — Encounter: Payer: Self-pay | Admitting: Medical-Surgical

## 2021-12-11 ENCOUNTER — Ambulatory Visit (HOSPITAL_COMMUNITY)
Admission: RE | Admit: 2021-12-11 | Discharge: 2021-12-11 | Disposition: A | Discharge status: Home / Self Care | Payer: Commercial Managed Care - HMO | Source: Ambulatory Visit | Attending: Medical-Surgical | Admitting: Medical-Surgical

## 2021-12-11 DIAGNOSIS — R911 Solitary pulmonary nodule: Secondary | ICD-10-CM | POA: Insufficient documentation

## 2021-12-11 LAB — GLUCOSE, CAPILLARY: Glucose-Capillary: 116 mg/dL — ABNORMAL HIGH (ref 70–99)

## 2021-12-11 MED ORDER — FLUDEOXYGLUCOSE F - 18 (FDG) INJECTION
9.5000 | Freq: Once | INTRAVENOUS | Status: AC
  Administered 2021-12-11: 9.37 via INTRAVENOUS

## 2022-01-17 ENCOUNTER — Other Ambulatory Visit: Payer: Self-pay | Admitting: Medical-Surgical

## 2022-01-18 ENCOUNTER — Other Ambulatory Visit: Payer: Self-pay | Admitting: Medical-Surgical

## 2022-01-18 MED ORDER — METFORMIN HCL ER 500 MG PO TB24: ORAL_TABLET | ORAL | 1 refills | Status: DC

## 2022-01-19 NOTE — Telephone Encounter (Signed)
Patient has been scheduled for Friday, 01/22/22. AMUCK

## 2022-01-19 NOTE — Telephone Encounter (Signed)
Patient is overdue for follow-up on diabetes.  Please call him to get him scheduled for a follow-up appointment.

## 2022-01-22 ENCOUNTER — Ambulatory Visit (INDEPENDENT_AMBULATORY_CARE_PROVIDER_SITE_OTHER): Payer: Self-pay | Admitting: Medical-Surgical

## 2022-01-22 ENCOUNTER — Encounter: Payer: Self-pay | Admitting: Medical-Surgical

## 2022-01-22 VITALS — BP 156/79 | HR 53 | Resp 20 | Ht 68.0 in | Wt 182.4 lb

## 2022-01-22 DIAGNOSIS — Z1211 Encounter for screening for malignant neoplasm of colon: Secondary | ICD-10-CM | POA: Insufficient documentation

## 2022-01-22 DIAGNOSIS — E1165 Type 2 diabetes mellitus with hyperglycemia: Secondary | ICD-10-CM

## 2022-01-22 DIAGNOSIS — J439 Emphysema, unspecified: Secondary | ICD-10-CM

## 2022-01-22 DIAGNOSIS — Z23 Encounter for immunization: Secondary | ICD-10-CM | POA: Insufficient documentation

## 2022-01-22 DIAGNOSIS — I10 Essential (primary) hypertension: Secondary | ICD-10-CM

## 2022-01-22 HISTORY — DX: Essential (primary) hypertension: I10

## 2022-01-22 LAB — POCT GLYCOSYLATED HEMOGLOBIN (HGB A1C): HbA1c, POC (controlled diabetic range): 5.1 % (ref 0.0–7.0)

## 2022-01-22 LAB — POCT UA - MICROALBUMIN
Albumin/Creatinine Ratio, Urine, POC: <30
Creatinine, POC: 50 mg/dL
Microalbumin Ur, POC: 10 mg/L

## 2022-01-22 MED ORDER — METFORMIN HCL ER 500 MG PO TB24: 500.0000 mg | ORAL_TABLET | Freq: Every day | ORAL | 1 refills | Status: DC

## 2022-01-22 MED ORDER — VALSARTAN 80 MG PO TABS
80.0000 mg | ORAL_TABLET | Freq: Every day | ORAL | 0 refills | Status: DC
Start: 2022-01-22 — End: 2022-06-15

## 2022-01-22 NOTE — Progress Notes (Signed)
Established Patient Office Visit  Subjective   Patient ID: Spencer Bishop, male   DOB: 29-Jul-1961 Age: 61 y.o. MRN: [id number]   Chief Complaint  Patient presents with   Diabetes   Follow-up    HPI Pleasant 61 year old male presenting for follow up on:  DM- taking Metformin 500mg  BID, tolerating well without side effects. Checking sugars a couple times a week but his wife does it and he doesn't know what his numbers look like. Trying to increase activity. Doing well on diet modifications.   HTN- taking Valsartan 40mg  daily, tolerating well. Checking BP every now and then but not regularly. Trying to limit dietary sodium.   Continues to smoke on a regular basis.  Notes that he is aware that if this increases his risk of blood clots and it worsens emphysema.  Has been doing fairly well managing his emphysema and only has trouble with certain weather changes.  Has his albuterol inhaler at home to use as needed.   Objective:    Vitals:   01/22/22 0837  BP: (!) 154/90  Pulse: 64  Resp: 20  Height: 5\' 8"  (1.727 m)  Weight: 182 lb 6.4 oz (82.7 kg)  SpO2: 99%  BMI (Calculated): 27.74   Physical Exam Vitals reviewed.  Constitutional:      General: He is not in acute distress.    Appearance: Normal appearance. He is obese. He is not ill-appearing.  HENT:     Head: Normocephalic.  Cardiovascular:     Rate and Rhythm: Normal rate.     Pulses: Normal pulses.     Heart sounds: Normal heart sounds. No murmur heard.    No friction rub. No gallop.  Pulmonary:     Effort: Pulmonary effort is normal. No respiratory distress.     Breath sounds: Normal breath sounds.  Skin:    General: Skin is warm and dry.  Neurological:     Mental Status: He is alert and oriented to person, place, and time.  Psychiatric:        Mood and Affect: Mood normal.        Behavior: Behavior normal.        Thought Content: Thought content normal.        Judgment: Judgment normal.     Results for  orders placed or performed in visit on 01/22/22 (from the past 24 hour(s))  POCT HgB A1C     Status: None   Collection Time: 01/22/22  8:48 AM  Result Value Ref Range   Hemoglobin A1C     HbA1c POC (<> result, manual entry)     HbA1c, POC (prediabetic range)     HbA1c, POC (controlled diabetic range) 5.1 0.0 - 7.0 %  POCT UA - Microalbumin     Status: Normal   Collection Time: 01/22/22  8:48 AM  Result Value Ref Range   Microalbumin Ur, POC 10 mg/L   Creatinine, POC 50 mg/dL   Albumin/Creatinine Ratio, Urine, POC <30        The 10-year ASCVD risk score (Arnett DK, et al., 2019) is: 21.7%   Values used to calculate the score:     Age: 69 years     Sex: Male     Is Non-Hispanic African American: No     Diabetic: Yes     Tobacco smoker: Yes     Systolic Blood Pressure: 154 mmHg     Is BP treated: Yes     HDL Cholesterol: 63 mg/dL  Total Cholesterol: 133 mg/dL   Assessment & Plan:   Essential hypertension, benign Blood pressure elevated on arrival today at 150/90.  Recheck still elevated.  Increase valsartan to 80 mg daily.  Monitor blood pressure at home with goal of 130/80 or less work on limiting dietary sodium.  Continue with increased activity.  Emphysema of lung (HCC) Stable.  Continue as needed albuterol.  Recommend smoking cessation.  Type 2 diabetes mellitus with hyperglycemia, without long-term current use of insulin (HCC) POCT hemoglobin A1c 5.1%.  Normal microalbumin.  Reduce metformin to 500 mg daily.  Continue dietary modifications and increased activity levels.  Need for shingles vaccine Shingrix No. 1 given in office today.  Will be due for the second shot in 2-6 months.  Okay to do this in a office visit or a nurse visit if desired.  Colon cancer screening Cologuard ordered.  Return in about 3 months (around 04/24/2022) for DVT follow up.  ___________________________________________ Thayer Ohm, DNP, APRN, FNP-BC Primary Care and Sports  Medicine [facility]

## 2022-01-22 NOTE — Assessment & Plan Note (Signed)
Shingrix No. 1 given in office today.  Will be due for the second shot in 2-6 months.  Okay to do this in a office visit or a nurse visit if desired.

## 2022-01-22 NOTE — Assessment & Plan Note (Signed)
Cologuard ordered

## 2022-01-22 NOTE — Assessment & Plan Note (Addendum)
Stable.  Continue as needed albuterol.  Recommend smoking cessation.

## 2022-01-22 NOTE — Assessment & Plan Note (Signed)
POCT hemoglobin A1c 5.1%.  Normal microalbumin.  Reduce metformin to 500 mg daily.  Continue dietary modifications and increased activity levels.

## 2022-01-22 NOTE — Assessment & Plan Note (Addendum)
Blood pressure elevated on arrival today at 150/90.  Recheck still elevated.  Increase valsartan to 80 mg daily.  Monitor blood pressure at home with goal of 130/80 or less work on limiting dietary sodium.  Continue with increased activity.

## 2022-02-18 ENCOUNTER — Other Ambulatory Visit: Payer: Self-pay | Admitting: Sports Medicine

## 2022-02-18 DIAGNOSIS — M1611 Unilateral primary osteoarthritis, right hip: Secondary | ICD-10-CM

## 2022-03-19 LAB — COLOGUARD: COLOGUARD: NEGATIVE

## 2022-04-02 ENCOUNTER — Telehealth: Payer: Self-pay | Admitting: Pharmacist

## 2022-04-02 MED ORDER — APIXABAN 5 MG PO TABS: 5.0000 mg | ORAL_TABLET | Freq: Two times a day (BID) | ORAL | 0 refills | Status: DC

## 2022-04-02 NOTE — Telephone Encounter (Signed)
I printed the Rx and had Joy sign it and placed in an envelope up front.

## 2022-04-02 NOTE — Telephone Encounter (Signed)
Spoke with the patients wife, Rosey Bath. She stated that the insurance company has been taking out a payment every month , so she don't know why the pharmacy is stating that they don't have insurance. I advised Rosey Bath about the 30-Day trial Eliquis card and advised her that she can come pick it up at the front desk on Monday.  The card requires a printed Rx to take to the pharmacy with the card.

## 2022-04-02 NOTE — Telephone Encounter (Signed)
Pharmacist Outreach  Contacted [facility] pharmacy on behalf of patient and primary care provider to coordinate care for eliquis, with question of insurance coverage.  [facility] pharmacy staff noted that upon attempting to run the prescription, the claim states "no insurance, coverage ended 01/13/22."   Recommend the following: Patient call insurance company if this is an error regarding lapse or termination of coverage Consider use of a "30 day Free Trial" Eliquis card (can be found in the file cabinet in [employer] pharmacist office) or if patient goes online for a google search of "eliquis 30 day free trial" they should be able to find a downloadable version to present to the pharmacy. This will cover a 30 day supply until insurance issue resolved.  Thank you for involving me in the care of this patient.  Lynnda Shields, PharmD Clinical Pharmacist [facility] [phone]

## 2022-05-03 ENCOUNTER — Ambulatory Visit: Payer: Commercial Managed Care - HMO | Admitting: Medical-Surgical

## 2022-05-07 ENCOUNTER — Ambulatory Visit (INDEPENDENT_AMBULATORY_CARE_PROVIDER_SITE_OTHER): Payer: Commercial Managed Care - HMO | Admitting: Medical-Surgical

## 2022-05-07 DIAGNOSIS — Z91199 Patient's noncompliance with other medical treatment and regimen due to unspecified reason: Secondary | ICD-10-CM

## 2022-05-07 DIAGNOSIS — Z23 Encounter for immunization: Secondary | ICD-10-CM

## 2022-05-07 NOTE — Progress Notes (Signed)
   Complete physical exam  Patient: Spencer Bishop   DOB: 06/05/1997   61 y.o. Male  MRN: [id number]  Subjective:    No chief complaint on file.   Spencer Bishop is a 61 y.o. male who presents today for a complete physical exam. She reports consuming a {diet types:17450} diet. {types:19826} She generally feels {DESC; WELL/FAIRLY WELL/POORLY:18703}. She reports sleeping {DESC; WELL/FAIRLY WELL/POORLY:18703}. She {does/does not:200015} have additional problems to discuss today.    Most recent fall risk assessment:    02/10/2022   10:42 AM  Fall Risk   Falls in the past year? 0  Number falls in past yr: 0  Injury with Fall? 0  Risk for fall due to : No Fall Risks  Follow up Falls evaluation completed     Most recent depression screenings:    02/10/2022   10:42 AM 01/01/2021   10:46 AM  PHQ 2/9 Scores  PHQ - 2 Score 0 0  PHQ- 9 Score 5     {VISON DENTAL STD PSA (Optional):27386}  {History (Optional):23778}  Patient Care Team: Maida Widger, NP as PCP - General (Nurse Practitioner)   Outpatient Medications Prior to Visit  Medication Sig   fluticasone (FLONASE) 50 MCG/ACT nasal spray Place 2 sprays into both nostrils in the morning and at bedtime. After 7 days, reduce to once daily.   norgestimate-ethinyl estradiol (SPRINTEC 28) 0.25-35 MG-MCG tablet Take 1 tablet by mouth daily.   Nystatin POWD Apply liberally to affected area 2 times per day   spironolactone (ALDACTONE) 100 MG tablet Take 1 tablet (100 mg total) by mouth daily.   No facility-administered medications prior to visit.    ROS        Objective:     There were no vitals taken for this visit. {Vitals History (Optional):23777}  Physical Exam   No results found for any visits on 03/18/22. {Show previous labs (optional):23779}    Assessment & Plan:    Routine Health Maintenance and Physical Exam  Immunization History  Administered Date(s) Administered   DTaP 08/19/1999, 10/15/1999,  12/24/1999, 09/08/2000, 03/24/2004   Hepatitis A 01/19/2008, 01/24/2009   Hepatitis B 06/06/1999, 07/14/1999, 12/24/1999   HiB (PRP-OMP) 08/19/1999, 10/15/1999, 12/24/1999, 09/08/2000   IPV 08/19/1999, 10/15/1999, 06/13/2000, 03/24/2004   Influenza,inj,Quad PF,6+ Mos 04/26/2014   Influenza-Unspecified 07/26/2012   MMR 06/13/2001, 03/24/2004   Meningococcal Polysaccharide 01/24/2012   Pneumococcal Conjugate-13 09/08/2000   Pneumococcal-Unspecified 12/24/1999, 03/08/2000   Tdap 01/24/2012   Varicella 06/13/2000, 01/19/2008    Health Maintenance  Topic Date Due   HIV Screening  Never done   Hepatitis C Screening  Never done   INFLUENZA VACCINE  03/16/2022   PAP-Cervical Cytology Screening  03/18/2022 (Originally 06/04/2020)   PAP SMEAR-Modifier  03/18/2022 (Originally 06/04/2020)   TETANUS/TDAP  03/18/2022 (Originally 01/23/2022)   HPV VACCINES  Discontinued   COVID-19 Vaccine  Discontinued    Discussed health benefits of physical activity, and encouraged her to engage in regular exercise appropriate for her age and condition.  Problem List Items Addressed This Visit   None Visit Diagnoses     Annual physical exam    -  Primary   Cervical cancer screening       Need for Tdap vaccination          No follow-ups on file.     Ikenna Ohms, NP   

## 2022-06-15 ENCOUNTER — Other Ambulatory Visit: Payer: Self-pay | Admitting: Medical-Surgical

## 2022-06-15 NOTE — Telephone Encounter (Signed)
Patient needs an appointment for further refills.  Last office visit 01/22/2022  No Show 05/07/2022  Last filled 01/22/2022  Sent 30 day supply to the pharmacy.

## 2022-06-16 NOTE — Telephone Encounter (Signed)
Called pt lvm to let them know that the Rx was filled for 30 days and needed to schedule a appointment for further refills. Tvt

## 2022-06-29 NOTE — Progress Notes (Signed)
   Established Patient Office Visit  Subjective   Patient ID: DAEMIEN FRONCZAK, male   DOB: 05-May-1961 Age: 61 y.o. MRN: [id number]   No chief complaint on file.   HPI Pleasant 60 year old male presenting today for the following:   Objective:    There were no vitals filed for this visit.  Physical Exam   No results found for this or any previous visit (from the past 24 hour(s)).   {Labs (Optional):23779}  The 10-year ASCVD risk score (Arnett DK, et al., 2019) is: 22.1%   Values used to calculate the score:     Age: 38 years     Sex: Male     Is Non-Hispanic African American: No     Diabetic: Yes     Tobacco smoker: Yes     Systolic Blood Pressure: 156 mmHg     Is BP treated: Yes     HDL Cholesterol: 63 mg/dL     Total Cholesterol: 133 mg/dL   Assessment & Plan:   No problem-specific Assessment & Plan notes found for this encounter.   No follow-ups on file.  ___________________________________________ Thayer Ohm, DNP, APRN, FNP-BC Primary Care and Sports Medicine [facility]

## 2022-06-30 ENCOUNTER — Ambulatory Visit (INDEPENDENT_AMBULATORY_CARE_PROVIDER_SITE_OTHER): Payer: Self-pay | Admitting: Medical-Surgical

## 2022-06-30 ENCOUNTER — Encounter: Payer: Self-pay | Admitting: Medical-Surgical

## 2022-06-30 VITALS — BP 128/74 | HR 63 | Resp 20 | Ht 68.0 in | Wt 182.1 lb

## 2022-06-30 DIAGNOSIS — E1165 Type 2 diabetes mellitus with hyperglycemia: Secondary | ICD-10-CM

## 2022-06-30 DIAGNOSIS — I824Z1 Acute embolism and thrombosis of unspecified deep veins of right distal lower extremity: Secondary | ICD-10-CM

## 2022-06-30 DIAGNOSIS — Z23 Encounter for immunization: Secondary | ICD-10-CM

## 2022-06-30 DIAGNOSIS — M1611 Unilateral primary osteoarthritis, right hip: Secondary | ICD-10-CM

## 2022-06-30 DIAGNOSIS — I10 Essential (primary) hypertension: Secondary | ICD-10-CM

## 2022-06-30 DIAGNOSIS — I824Y1 Acute embolism and thrombosis of unspecified deep veins of right proximal lower extremity: Secondary | ICD-10-CM

## 2022-06-30 LAB — CBC WITH DIFFERENTIAL/PLATELET
Absolute Monocytes: 239 cells/uL (ref 200–950)
Basophils Absolute: 57 cells/uL (ref 0–200)
Basophils Relative: 1 %
Eosinophils Absolute: 131 cells/uL (ref 15–500)
Eosinophils Relative: 2.3 %
HCT: 45.7 % (ref 38.5–50.0)
Hemoglobin: 15.9 g/dL (ref 13.2–17.1)
Lymphs Abs: 1870 cells/uL (ref 850–3900)
MCH: 33.6 pg — ABNORMAL HIGH (ref 27.0–33.0)
MCHC: 34.8 g/dL (ref 32.0–36.0)
MCV: 96.6 fL (ref 80.0–100.0)
MPV: 10.4 fL (ref 7.5–12.5)
Monocytes Relative: 4.2 %
Neutro Abs: 3403 cells/uL (ref 1500–7800)
Neutrophils Relative %: 59.7 %
Platelets: 145 10*3/uL (ref 140–400)
RBC: 4.73 10*6/uL (ref 4.20–5.80)
RDW: 12.5 % (ref 11.0–15.0)
Total Lymphocyte: 32.8 %
WBC: 5.7 10*3/uL (ref 3.8–10.8)

## 2022-06-30 LAB — COMPLETE METABOLIC PANEL WITH GFR
AG Ratio: 2 (calc) (ref 1.0–2.5)
ALT: 14 U/L (ref 9–46)
AST: 15 U/L (ref 10–35)
Albumin: 4.7 g/dL (ref 3.6–5.1)
Alkaline phosphatase (APISO): 88 U/L (ref 35–144)
BUN: 12 mg/dL (ref 7–25)
CO2: 23 mmol/L (ref 20–32)
Calcium: 9.5 mg/dL (ref 8.6–10.3)
Chloride: 104 mmol/L (ref 98–110)
Creat: 0.82 mg/dL (ref 0.70–1.30)
Globulin: 2.4 g/dL (calc) (ref 1.9–3.7)
Glucose, Bld: 90 mg/dL (ref 65–99)
Potassium: 4.3 mmol/L (ref 3.5–5.3)
Sodium: 137 mmol/L (ref 135–146)
Total Bilirubin: 0.6 mg/dL (ref 0.2–1.2)
Total Protein: 7.1 g/dL (ref 6.1–8.1)
eGFR: 101 mL/min/{1.73_m2} (ref >=60)

## 2022-06-30 LAB — LIPID PANEL
Cholesterol: 151 mg/dL (ref <200)
HDL: 66 mg/dL (ref >=40)
LDL Cholesterol (Calc): 70 mg/dL (calc)
Non-HDL Cholesterol (Calc): 85 mg/dL (calc) (ref <130)
Total CHOL/HDL Ratio: 2.3 (calc) (ref <5.0)
Triglycerides: 73 mg/dL (ref <150)

## 2022-06-30 LAB — POCT GLYCOSYLATED HEMOGLOBIN (HGB A1C): Hemoglobin A1C: 5.5 % (ref 4.0–5.6)

## 2022-06-30 MED ORDER — VALSARTAN 80 MG PO TABS: 80.0000 mg | ORAL_TABLET | Freq: Every day | ORAL | 1 refills | Status: DC

## 2022-06-30 MED ORDER — TRAMADOL HCL 50 MG PO TABS: 50.0000 mg | ORAL_TABLET | Freq: Two times a day (BID) | ORAL | 0 refills | Status: DC

## 2022-07-02 ENCOUNTER — Ambulatory Visit (INDEPENDENT_AMBULATORY_CARE_PROVIDER_SITE_OTHER): Payer: Commercial Managed Care - HMO

## 2022-07-02 ENCOUNTER — Other Ambulatory Visit: Payer: Self-pay | Admitting: Medical-Surgical

## 2022-07-02 ENCOUNTER — Ambulatory Visit: Payer: Self-pay

## 2022-07-02 DIAGNOSIS — I824Y1 Acute embolism and thrombosis of unspecified deep veins of right proximal lower extremity: Secondary | ICD-10-CM

## 2022-07-02 DIAGNOSIS — I824Z1 Acute embolism and thrombosis of unspecified deep veins of right distal lower extremity: Secondary | ICD-10-CM

## 2022-07-13 ENCOUNTER — Other Ambulatory Visit: Payer: Self-pay | Admitting: Medical-Surgical

## 2022-07-15 ENCOUNTER — Other Ambulatory Visit: Payer: Self-pay | Admitting: Medical-Surgical

## 2022-07-29 ENCOUNTER — Encounter: Payer: Self-pay | Admitting: Medical-Surgical

## 2022-07-29 DIAGNOSIS — E1169 Type 2 diabetes mellitus with other specified complication: Secondary | ICD-10-CM

## 2022-07-29 MED ORDER — ROSUVASTATIN CALCIUM 10 MG PO TABS: 10.0000 mg | ORAL_TABLET | Freq: Every day | ORAL | 3 refills | Status: DC

## 2022-08-25 ENCOUNTER — Ambulatory Visit: Payer: Commercial Managed Care - HMO | Admitting: Sports Medicine

## 2022-08-25 ENCOUNTER — Ambulatory Visit: Payer: Self-pay

## 2022-08-25 ENCOUNTER — Ambulatory Visit (INDEPENDENT_AMBULATORY_CARE_PROVIDER_SITE_OTHER): Payer: Commercial Managed Care - HMO

## 2022-08-25 DIAGNOSIS — M7541 Impingement syndrome of right shoulder: Secondary | ICD-10-CM | POA: Diagnosis not present

## 2022-08-25 DIAGNOSIS — M1611 Unilateral primary osteoarthritis, right hip: Secondary | ICD-10-CM

## 2022-08-25 HISTORY — DX: Impingement syndrome of right shoulder: M75.41

## 2022-08-25 MED ORDER — TRIAMCINOLONE ACETONIDE 40 MG/ML IJ SUSP
80.0000 mg | Freq: Once | INTRAMUSCULAR | Status: AC
  Administered 2022-08-25: 80 mg via INTRAMUSCULAR

## 2022-08-25 NOTE — Progress Notes (Signed)
    Procedures performed today:    Procedure: Real-time Ultrasound Guided injection of the right subacromial bursa Device: Samsung HS60  Verbal informed consent obtained.  Time-out conducted.  Noted no overlying erythema, induration, or other signs of local infection.  Skin prepped in a sterile fashion.  Local anesthesia: Topical Ethyl chloride.  With sterile technique and under real time ultrasound guidance: Noted rotator cuff tearing, 1 cc Kenalog 40, 1 cc lidocaine, 1 cc bupivacaine injected easily Completed without difficulty  Advised to call if fevers/chills, erythema, induration, drainage, or persistent bleeding.  Images permanently stored and available for review in PACS.  Impression: Technically successful ultrasound guided injection.  Procedure: Real-time Ultrasound Guided injection of the right hip joint Device: Samsung HS60  Verbal informed consent obtained.  Time-out conducted.  Noted no overlying erythema, induration, or other signs of local infection.  Skin prepped in a sterile fashion.  Local anesthesia: Topical Ethyl chloride.  With sterile technique and under real time ultrasound guidance: Noted arthritic joint, 1 cc Kenalog 40, 2 cc lidocaine, 2 cc bupivacaine injected easily Completed without difficulty  Advised to call if fevers/chills, erythema, induration, drainage, or persistent bleeding.  Images permanently stored and available for review in PACS.  Impression: Technically successful ultrasound guided injection.  Independent interpretation of notes and tests performed by another provider:   None.  Brief History, Exam, Impression, and Recommendations:    Impingement syndrome, shoulder, right Pleasant 62 year old male, pain of the deltoid worse with abduction, history of a subacromial injection many years ago with good effect, he is requesting another 1 today, we did the injection, I would like him to do some home physical therapy, return to see me 6  weeks.  Primary osteoarthritis of right hip Osteoarthritis, on Eliquis so avoiding NSAIDs, we did an injection approximately a year ago and it worked well until recently. Repeat right hip joint injection, return to see me 6 weeks.    ____________________________________________ Gwen Her. Dianah Field, M.D., ABFM., CAQSM., AME. Primary Care and Sports Medicine [facility]  Adjunct Professor of [employer] [facility] of Medicine  Risk manager

## 2022-08-25 NOTE — Assessment & Plan Note (Signed)
Osteoarthritis, on Eliquis so avoiding NSAIDs, we did an injection approximately a year ago and it worked well until recently. Repeat right hip joint injection, return to see me 6 weeks.

## 2022-08-25 NOTE — Assessment & Plan Note (Signed)
Pleasant 62 year old male, pain of the deltoid worse with abduction, history of a subacromial injection many years ago with good effect, he is requesting another 1 today, we did the injection, I would like him to do some home physical therapy, return to see me 6 weeks.

## 2022-09-01 LAB — VENOUS THROMBOSIS HYPERCOAGULABILITY PANEL WITH REFLEX
ACTIVATED PROTEIN C RESISTANCE: 5 ratio (ref >=2.1)
AntiThromb III Func: 114 % normal (ref 80–135)
Anticardiolipin IgG: <2 GPL-U/mL (ref <20.0)
Anticardiolipin IgM: <2 MPL-U/mL (ref <20.0)
Beta-2 Glyco 1 IgM: <2 U/mL (ref <20.0)
Beta-2 Glyco I IgG: <2 U/mL (ref <20.0)
Factor-VIII Activity: 110 % normal (ref 50–180)
PROTEIN S ANTIGEN, TOTAL: 127 % normal (ref 70–140)
PROTHROMBIN (FACTOR II): NEGATIVE
PTT-LA Screen: 30 s (ref <=40)
Protein C Activity: 118 % normal (ref 70–180)
Protein S Ag, Free: 129 % normal (ref 57–171)
dRVVT: 33 s (ref <=45)

## 2022-10-06 ENCOUNTER — Ambulatory Visit: Payer: Commercial Managed Care - HMO | Admitting: Sports Medicine

## 2022-11-23 IMAGING — DX DG HIP (WITH OR WITHOUT PELVIS) 4+V*R*
3 series · 3 of 3 positions shown · non-contrast
Comparison: None.

CLINICAL DATA: Chronic right hip pain, no known injury, initial
encounter

EXAM:
DG HIP (WITH OR WITHOUT PELVIS) 3V RIGHT

[pelvis ap]
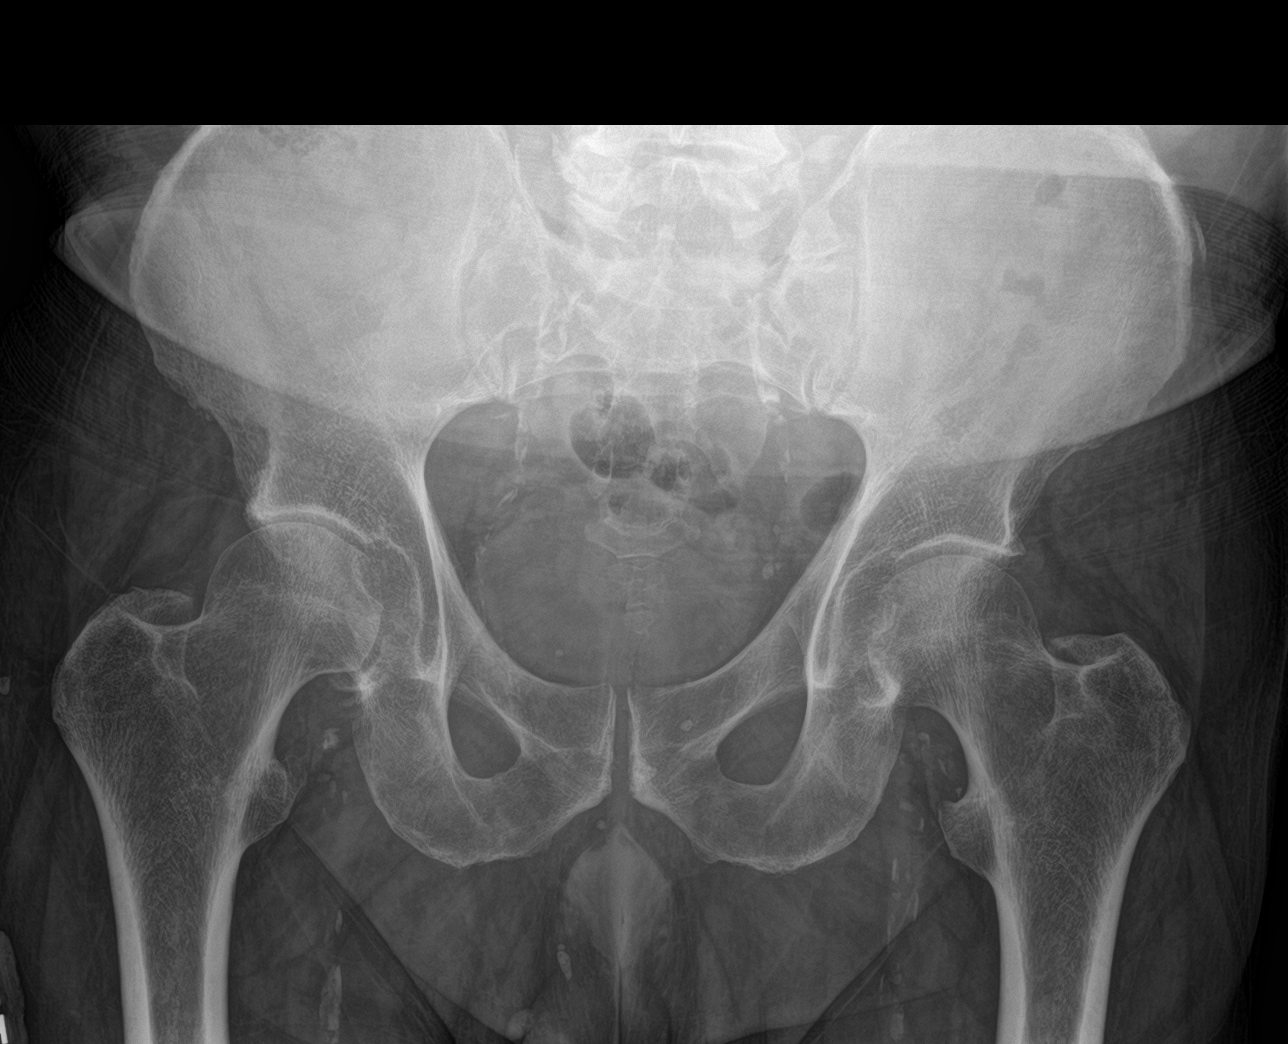

[hip ap]
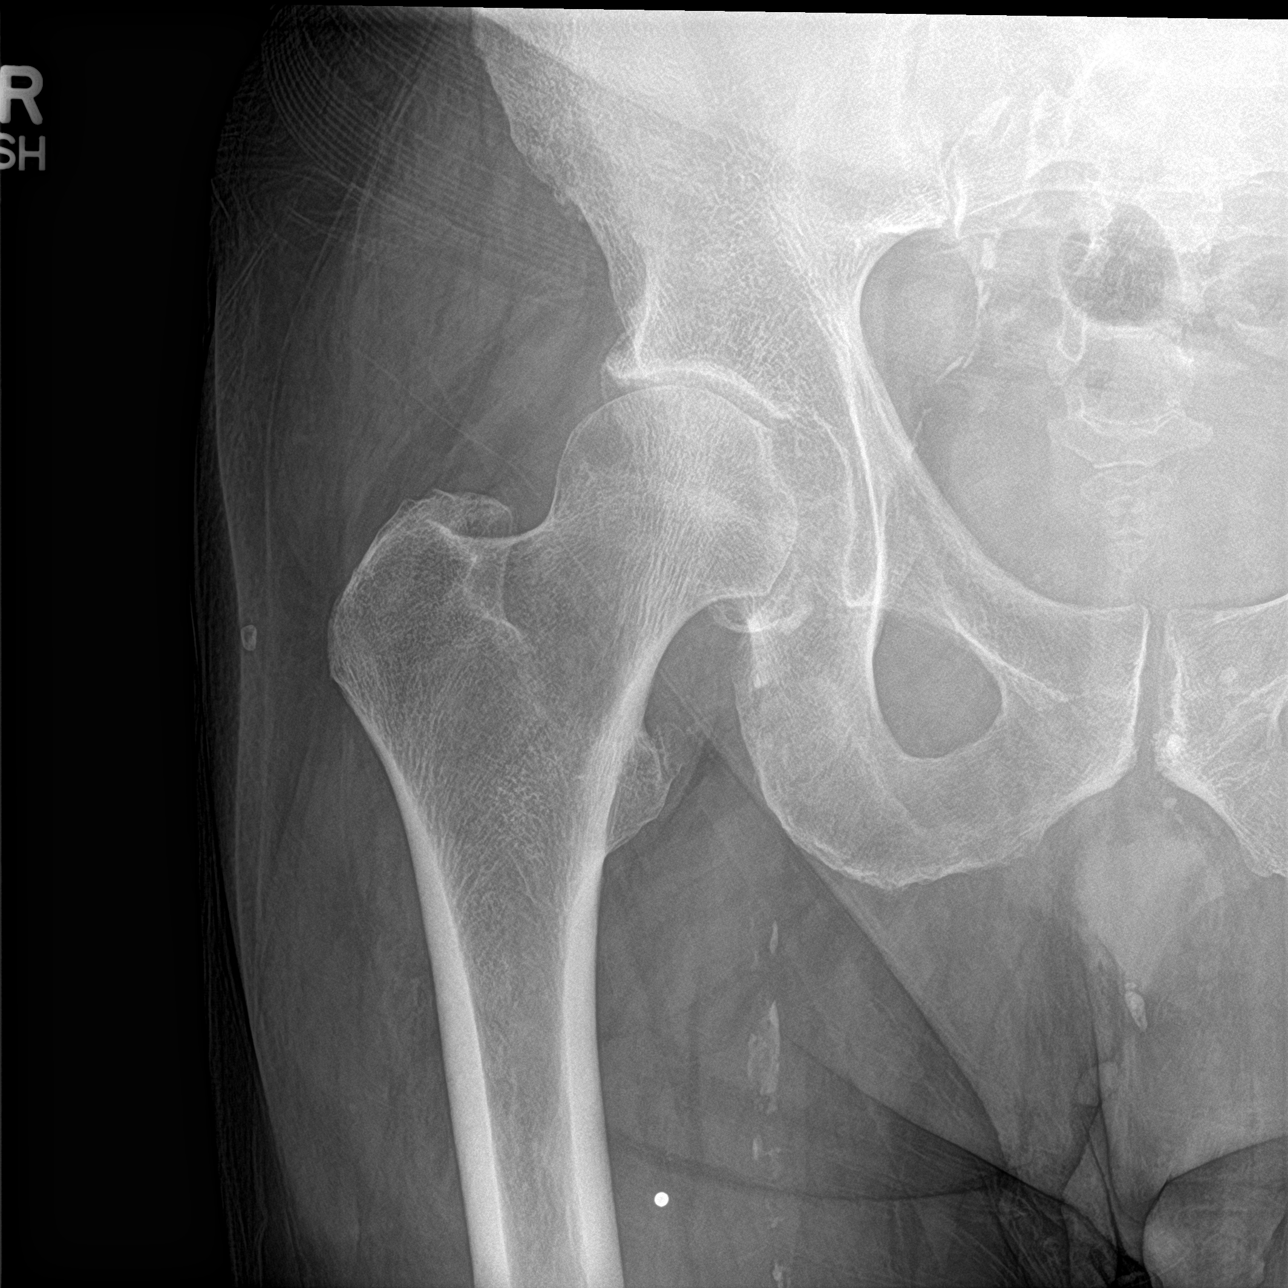

[hip lat]
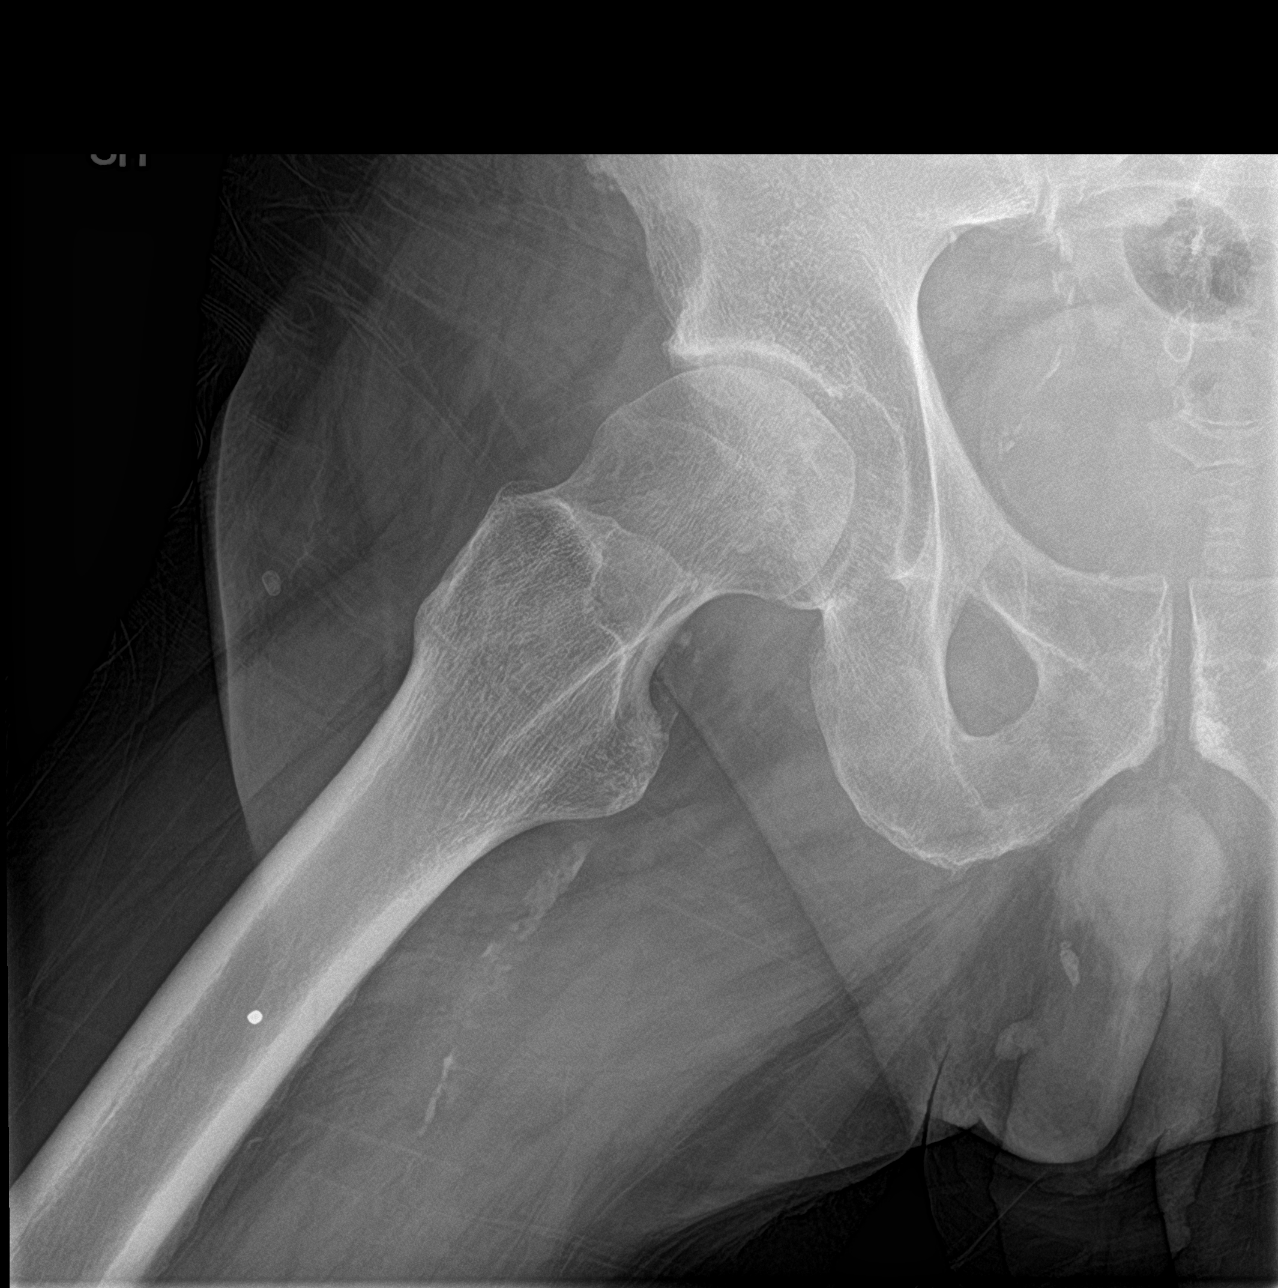

[3 of 3 positions shown; findings below may reference images not displayed]

FINDINGS: Pelvic ring is within normal limits. No acute fracture or
dislocation is noted. No soft tissue abnormality is seen. Vascular
calcifications are noted.
IMPRESSION: No acute abnormality noted.

## 2023-01-25 ENCOUNTER — Other Ambulatory Visit: Payer: Self-pay | Admitting: Medical-Surgical

## 2023-02-02 ENCOUNTER — Other Ambulatory Visit: Payer: Self-pay | Admitting: Medical-Surgical

## 2023-02-02 DIAGNOSIS — E1169 Type 2 diabetes mellitus with other specified complication: Secondary | ICD-10-CM

## 2023-05-03 IMAGING — CT NM PET TUM IMG INITIAL (PI) SKULL BASE T - THIGH
1 of 7 series · 1 of 25 positions shown · non-contrast
Comparison: Chest CT 11/30/2021

CLINICAL DATA: Initial treatment strategy for left lower lobe
subpleural pulmonary lesion recent lung cancer screening chest CT.

EXAM:
NUCLEAR MEDICINE PET SKULL BASE TO THIGH
TECHNIQUE: 9.37 mCi F-18 FDG was injected intravenously. Full-ring PET imaging
was performed from the skull base to thigh after the radiotracer. CT
data was obtained and used for attenuation correction and anatomic
localization.
Fasting blood glucose: 116 mg/dl

[Series 4: ct sk_thigh 5.0 hd_fov · axial · 5.0mm · 1.17mm/px · 1 of 245 slices shown]
[im 245/245  brain]
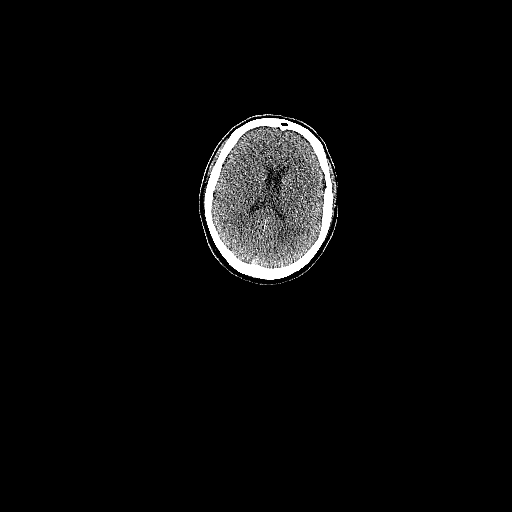

[1 of 25 positions shown; findings below may reference images not displayed]

FINDINGS: Mediastinal blood pool activity: SUV max

Liver activity: SUV max NA

NECK: No hypermetabolic lymph nodes in the neck.

Incidental CT findings: Mucous retention cyst or noted in the right
maxillary sinus.

CHEST: The subpleural density at the left lung base has resolved and
was likely an area of subpleural atelectasis. No worrisome pulmonary
lesions or pulmonary nodules. No enlarged or hypermetabolic
mediastinal or hilar nodes. Scattered small nodes are stable.

Incidental CT findings: Stable underlying emphysematous changes and
areas of pulmonary scarring. Dependent bibasilar atelectasis. No
bronchiectasis.

Stable age advanced three-vessel coronary artery calcifications.

ABDOMEN/PELVIS: No abnormal hypermetabolic activity within the
liver, pancreas, adrenal glands, or spleen. No hypermetabolic lymph
nodes in the abdomen or pelvis.

Incidental CT findings: Scattered age advanced atherosclerotic
calcifications involving the aorta and iliac arteries. Left inguinal
hernia containing fat.

SKELETON: No focal hypermetabolic activity to suggest skeletal
metastasis.

Incidental CT findings: none
IMPRESSION: 1. Resolution of subpleural nodular density seen on the prior chest
CT. No hypermetabolic pulmonary lesions or hypermetabolic adenopathy
involving the chest.
2. No significant findings in the abdomen/pelvis and no significant
bony findings.
3. Age advanced atherosclerotic calcifications involving the
coronary arteries and abdominal aorta and iliac arteries.
4. Stable emphysematous changes and areas of pulmonary scarring.
Recommend resumption of routine lung cancer screening chest CT
program.

## 2023-05-10 ENCOUNTER — Other Ambulatory Visit: Payer: Self-pay | Admitting: Medical-Surgical

## 2023-05-10 DIAGNOSIS — E1169 Type 2 diabetes mellitus with other specified complication: Secondary | ICD-10-CM

## 2023-06-29 ENCOUNTER — Ambulatory Visit: Payer: Commercial Managed Care - HMO | Admitting: Sports Medicine

## 2023-07-01 ENCOUNTER — Other Ambulatory Visit: Payer: Self-pay | Admitting: Medical-Surgical

## 2023-07-01 DIAGNOSIS — E1169 Type 2 diabetes mellitus with other specified complication: Secondary | ICD-10-CM

## 2023-07-05 ENCOUNTER — Encounter: Payer: Commercial Managed Care - HMO | Admitting: Medical-Surgical

## 2023-08-11 ENCOUNTER — Ambulatory Visit (INDEPENDENT_AMBULATORY_CARE_PROVIDER_SITE_OTHER): Payer: Commercial Managed Care - HMO | Admitting: Sports Medicine

## 2023-08-11 ENCOUNTER — Other Ambulatory Visit (INDEPENDENT_AMBULATORY_CARE_PROVIDER_SITE_OTHER): Payer: Commercial Managed Care - HMO

## 2023-08-11 ENCOUNTER — Other Ambulatory Visit: Payer: Self-pay | Admitting: Medical-Surgical

## 2023-08-11 DIAGNOSIS — E1169 Type 2 diabetes mellitus with other specified complication: Secondary | ICD-10-CM

## 2023-08-11 DIAGNOSIS — M1611 Unilateral primary osteoarthritis, right hip: Secondary | ICD-10-CM | POA: Diagnosis not present

## 2023-08-11 MED ORDER — TRIAMCINOLONE ACETONIDE 40 MG/ML IJ SUSP
40.0000 mg | Freq: Once | INTRAMUSCULAR | Status: AC
Start: 2023-08-11 — End: 2023-08-11
  Administered 2023-08-11: 40 mg via INTRAMUSCULAR

## 2023-08-11 MED ORDER — TRAMADOL HCL 50 MG PO TABS: 50.0000 mg | ORAL_TABLET | Freq: Every day | ORAL | 3 refills | Status: DC | PRN

## 2023-08-11 NOTE — Addendum Note (Signed)
Addended by: Carren Rang A on: 08/11/2023 09:46 AM   Modules accepted: Orders

## 2023-08-11 NOTE — Assessment & Plan Note (Signed)
Right hip osteoarthritis, on Eliquis avoiding NSAIDs, last injection was in January 2024 and did well, repeat injection at right hip joint today, return as needed.

## 2023-08-11 NOTE — Progress Notes (Signed)
    Procedures performed today:    Procedure: Real-time Ultrasound Guided injection of the right hip joint Device: Samsung HS60  Verbal informed consent obtained.  Time-out conducted.  Noted no overlying erythema, induration, or other signs of local infection.  Skin prepped in a sterile fashion.  Local anesthesia: Topical Ethyl chloride.  With sterile technique and under real time ultrasound guidance: Noted arthritic joint, 1 cc Kenalog 40, 2 cc lidocaine, 2 cc bupivacaine injected easily Completed without difficulty  Advised to call if fevers/chills, erythema, induration, drainage, or persistent bleeding.  Images permanently stored and available for review in PACS.  Impression: Technically successful ultrasound guided injection.  Independent interpretation of notes and tests performed by another provider:   None.  Brief History, Exam, Impression, and Recommendations:    Primary osteoarthritis of right hip Right hip osteoarthritis, on Eliquis avoiding NSAIDs, last injection was in January 2024 and did well, repeat injection at right hip joint today, return as needed.    ____________________________________________ Ihor Austin. Benjamin Stain, M.D., ABFM., CAQSM., AME. Primary Care and Sports Medicine [facility]  Adjunct Professor of Family Medicine  [employer]  Restaurant manager, fast food

## 2023-08-16 ENCOUNTER — Ambulatory Visit (INDEPENDENT_AMBULATORY_CARE_PROVIDER_SITE_OTHER): Payer: Commercial Managed Care - HMO | Admitting: Medical-Surgical

## 2023-08-16 ENCOUNTER — Encounter: Payer: Self-pay | Admitting: Medical-Surgical

## 2023-08-16 VITALS — BP 128/70 | HR 81 | Resp 20 | Ht 68.0 in | Wt 186.7 lb

## 2023-08-16 DIAGNOSIS — E1165 Type 2 diabetes mellitus with hyperglycemia: Secondary | ICD-10-CM | POA: Diagnosis not present

## 2023-08-16 DIAGNOSIS — Z0000 Encounter for general adult medical examination without abnormal findings: Secondary | ICD-10-CM | POA: Diagnosis not present

## 2023-08-16 DIAGNOSIS — J439 Emphysema, unspecified: Secondary | ICD-10-CM

## 2023-08-16 DIAGNOSIS — I700 Atherosclerosis of aorta: Secondary | ICD-10-CM

## 2023-08-16 DIAGNOSIS — N522 Drug-induced erectile dysfunction: Secondary | ICD-10-CM

## 2023-08-16 DIAGNOSIS — Z23 Encounter for immunization: Secondary | ICD-10-CM | POA: Diagnosis not present

## 2023-08-16 DIAGNOSIS — H6123 Impacted cerumen, bilateral: Secondary | ICD-10-CM

## 2023-08-16 DIAGNOSIS — F17200 Nicotine dependence, unspecified, uncomplicated: Secondary | ICD-10-CM | POA: Diagnosis not present

## 2023-08-16 DIAGNOSIS — I10 Essential (primary) hypertension: Secondary | ICD-10-CM

## 2023-08-16 DIAGNOSIS — E785 Hyperlipidemia, unspecified: Secondary | ICD-10-CM

## 2023-08-16 DIAGNOSIS — E1169 Type 2 diabetes mellitus with other specified complication: Secondary | ICD-10-CM

## 2023-08-16 DIAGNOSIS — Z7984 Long term (current) use of oral hypoglycemic drugs: Secondary | ICD-10-CM

## 2023-08-16 LAB — POCT GLYCOSYLATED HEMOGLOBIN (HGB A1C)
HbA1c, POC (controlled diabetic range): 5.8 % (ref 0.0–7.0)
Hemoglobin A1C: 5.8 % — AB (ref 4.0–5.6)

## 2023-08-16 LAB — POCT UA - MICROALBUMIN
Creatinine, POC: 300 mg/dL
Microalbumin Ur, POC: 80 mg/L

## 2023-08-16 MED ORDER — ROSUVASTATIN CALCIUM 10 MG PO TABS: 10.0000 mg | ORAL_TABLET | Freq: Every day | ORAL | 3 refills | Status: DC

## 2023-08-16 MED ORDER — SILDENAFIL CITRATE 20 MG PO TABS: 20.0000 mg | ORAL_TABLET | ORAL | 11 refills | Status: AC | PRN

## 2023-08-16 MED ORDER — METFORMIN HCL ER 500 MG PO TB24: 500.0000 mg | ORAL_TABLET | Freq: Every day | ORAL | 3 refills | Status: AC

## 2023-08-16 MED ORDER — VALSARTAN 80 MG PO TABS: 80.0000 mg | ORAL_TABLET | Freq: Every day | ORAL | 3 refills | Status: DC

## 2023-08-16 NOTE — Patient Instructions (Signed)

## 2023-08-16 NOTE — Progress Notes (Signed)
 Complete physical exam  Patient: Spencer Bishop   DOB: 1961-03-09   62 y.o. Male  MRN: [id number]  Subjective:    Chief Complaint  Patient presents with   Annual Exam   Spencer Bishop is a 62 y.o. male who presents today for a complete physical exam. He reports consuming a general diet. The patient does not participate in regular exercise at present. He generally feels well. He reports sleeping well. He does not have additional problems to discuss today.    Most recent fall risk assessment:    08/16/2023    9:18 AM  Fall Risk   Falls in the past year? 1  Number falls in past yr: 0  Injury with Fall? 1  Risk for fall due to : History of fall(s)  Follow up Falls evaluation completed     Most recent depression screenings:    08/16/2023    9:18 AM 06/30/2022    9:21 AM  PHQ 2/9 Scores  PHQ - 2 Score 0 0    Vision:Not within last year , Dental: No current dental problems and Receives regular dental care, and PSA: Agrees to PSA testing    Patient Care Team: Willo Mini, NP as PCP - General (Nurse Practitioner)   Outpatient Medications Prior to Visit  Medication Sig   albuterol (VENTOLIN HFA) 108 (90 Base) MCG/ACT inhaler Inhale 2 puffs into the lungs every 6 (six) hours as needed for wheezing or cough.   aspirin EC 81 MG tablet Take 81 mg by mouth daily. Swallow whole.   blood glucose meter kit and supplies KIT Dispense based on patient and insurance preference. Use up to four times daily as directed. Please include lancets, test strips, control solution.   glucose blood (FREESTYLE LITE) test strip Dx DM E11.9 Check fasting blood sugar daily and 2 hours after largest meal 2 times a week.   Lancets (FREESTYLE) lancets Dx DM E11.9 Check fasting blood sugar daily and 2 hours after largest meal 2 times a week.   traMADol  (ULTRAM ) 50 MG tablet Take 1 tablet (50 mg total) by mouth daily as needed.   [DISCONTINUED] rosuvastatin  (CRESTOR ) 10 MG tablet Take 1 tablet (10 mg total) by  mouth daily.   [DISCONTINUED] valsartan  (DIOVAN ) 80 MG tablet Take 1 tablet (80 mg total) by mouth daily.   [DISCONTINUED] ELIQUIS  5 MG TABS tablet Take 1 tablet (5 mg total) by mouth 2 (two) times daily.   [DISCONTINUED] metFORMIN  (GLUCOPHAGE  XR) 500 MG 24 hr tablet Take 1 tablet (500 mg total) by mouth daily with breakfast.   No facility-administered medications prior to visit.    Review of Systems  Constitutional:  Negative for chills, fever, malaise/fatigue and weight loss.  HENT:  Negative for congestion, ear pain, hearing loss, sinus pain and sore throat.   Eyes:  Negative for blurred vision, photophobia and pain.  Respiratory:  Positive for cough. Negative for shortness of breath and wheezing.   Cardiovascular:  Negative for chest pain, palpitations and leg swelling.  Gastrointestinal:  Negative for abdominal pain, constipation, diarrhea, heartburn, nausea and vomiting.  Genitourinary:  Negative for dysuria, frequency and urgency.  Musculoskeletal:  Positive for joint pain. Negative for falls and neck pain.  Skin:  Negative for itching and rash.  Neurological:  Negative for dizziness, weakness and headaches.  Endo/Heme/Allergies:  Negative for polydipsia. Does not bruise/bleed easily.  Psychiatric/Behavioral:  Negative for depression, substance abuse and suicidal ideas. The patient is not nervous/anxious and does not have  insomnia.      Objective:    BP 128/70 (BP Location: Left Arm, Cuff Size: Normal)   Pulse 81   Resp 20   Ht 5' 8 (1.727 m)   Wt 186 lb 11.2 oz (84.7 kg)   SpO2 99%   BMI 28.39 kg/m    Physical Exam Vitals reviewed.  Constitutional:      General: He is not in acute distress.    Appearance: Normal appearance. He is not ill-appearing.  HENT:     Head: Normocephalic and atraumatic.     Right Ear: Ear canal and external ear normal. There is impacted cerumen.     Left Ear: Ear canal and external ear normal. There is impacted cerumen.     Nose: Nose  normal. No congestion or rhinorrhea.     Mouth/Throat:     Mouth: Mucous membranes are moist.     Pharynx: No oropharyngeal exudate or posterior oropharyngeal erythema.  Eyes:     General: No scleral icterus.       Right eye: No discharge.        Left eye: No discharge.     Extraocular Movements: Extraocular movements intact.     Conjunctiva/sclera: Conjunctivae normal.     Pupils: Pupils are equal, round, and reactive to light.  Neck:     Thyroid: No thyromegaly.     Vascular: No carotid bruit or JVD.     Trachea: Trachea normal.  Cardiovascular:     Rate and Rhythm: Normal rate and regular rhythm.     Pulses: Normal pulses.     Heart sounds: Normal heart sounds. No murmur heard.    No friction rub. No gallop.  Pulmonary:     Effort: Pulmonary effort is normal. No respiratory distress.     Breath sounds: Normal breath sounds. No wheezing.  Abdominal:     General: Bowel sounds are normal. There is no distension.     Palpations: Abdomen is soft.     Tenderness: There is no abdominal tenderness. There is no guarding.  Musculoskeletal:        General: Normal range of motion.     Cervical back: Normal range of motion and neck supple.  Lymphadenopathy:     Cervical: No cervical adenopathy.  Skin:    General: Skin is warm and dry.  Neurological:     Mental Status: He is alert and oriented to person, place, and time.     Cranial Nerves: No cranial nerve deficit.  Psychiatric:        Mood and Affect: Mood normal.        Behavior: Behavior normal.        Thought Content: Thought content normal.        Judgment: Judgment normal.   No results found for any visits on 08/16/23.     Assessment & Plan:    Routine Health Maintenance and Physical Exam  Immunization History  Administered Date(s) Administered   Influenza Whole 06/29/2007   Tdap 11/14/2020   Zoster Recombinant(Shingrix ) 01/22/2022, 06/30/2022    Health Maintenance  Topic Date Due   OPHTHALMOLOGY EXAM  Never  done   HEMOGLOBIN A1C  12/29/2022   Diabetic kidney evaluation - Urine ACR  01/23/2023   INFLUENZA VACCINE  03/17/2023   Diabetic kidney evaluation - eGFR measurement  07/01/2023   FOOT EXAM  08/15/2024   Fecal DNA (Cologuard)  03/11/2025   DTaP/Tdap/Td (2 - Td or Tdap) 11/15/2030   Zoster Vaccines- Shingrix   Completed  HPV VACCINES  Aged Out   COVID-19 Vaccine  Discontinued   Hepatitis C Screening  Discontinued   HIV Screening  Discontinued   Discussed health benefits of physical activity, and encouraged him to engage in regular exercise appropriate for his age and condition.  1. Annual physical exam (Primary) Checking labs as below.  Recommend updating vision and dental care.  Wellness information provided with AVS. - CMP14+EGFR - CBC with Differential/Platelet  2. Type 2 diabetes mellitus with hyperglycemia, without long-term current use of insulin  (HCC) POCT hemoglobin A1c 5.8%.  Checking microalbumin today.  Continue metformin  XR 500 mg daily. - POCT HgB A1C - POCT UA - Microalbumin  3. Essential hypertension, benign Blood pressure is at goal today.  Continue valsartan  80 mg daily.  4. Current every day smoker Continue to recommend smoking cessation.  He is contemplating it but not ready to quit.  Last chest CT and PET scan showing a nodule greater than 8 mm however recommendations were to resume yearly lung cancer screenings.  Ordering CT chest lung cancer screening today. - CT CHEST LUNG CA SCREEN LOW DOSE W/O CM; Future  5. Aortic atherosclerosis (HCC) Currently taking Crestor  10 mg daily, tolerating well without side effects.  Checking lipids today.  Continue Crestor .  6. Pulmonary emphysema, unspecified emphysema type (HCC) As noted above, he is a current every day smoker.  Updating CT chest for lung cancer screening.  7. Hyperlipidemia associated with type 2 diabetes mellitus (HCC) Checking labs as below.  Continue Crestor  10 mg daily. - CMP14+EGFR - Lipid  panel - rosuvastatin  (CRESTOR ) 10 MG tablet; Take 1 tablet (10 mg total) by mouth daily.  Dispense: 90 tablet; Refill: 3  8. Drug-induced erectile dysfunction Experiencing some issues with erectile dysfunction likely related to diabetes and antihypertensives.  Adding sildenafil  20-100 mg daily as needed.  Cautioned against using this with nitrates.  9.  Cerumen impaction Medical necessity statement: On physical examination, cerumen impairs clinically significant portions of the external auditory canal, and tympanic membrane. Noted obstructive, copious cerumen that cannot be removed without magnification and instrumentations  Consent: Discussed benefits and risks of procedure and verbal consent obtained Procedure: Patient was prepped for the procedure. Utilized an otoscope to assess and take note of the ear canal, the tympanic membrane, and the presence, amount, and placement of the cerumen. Gentle water irrigation and soft plastic curette was utilized to remove cerumen.  Post procedure examination: shows cerumen was completely removed. Patient tolerated procedure well. The patient is made aware that they may experience temporary vertigo, temporary hearing loss, and temporary discomfort. If these symptom last for more than 24 hours to call the clinic or proceed to the ED.  Return in about 6 months (around 02/13/2024) for chronic disease follow up.   Tilman Mcclaren, NP

## 2023-08-17 LAB — CMP14+EGFR
ALT: 24 [IU]/L (ref 0–44)
AST: 23 [IU]/L (ref 0–40)
Albumin: 4.7 g/dL (ref 3.8–4.9)
Alkaline Phosphatase: 149 [IU]/L — ABNORMAL HIGH (ref 44–121)
BUN/Creatinine Ratio: 14 (ref 10–24)
BUN: 14 mg/dL (ref 8–27)
Bilirubin Total: 0.7 mg/dL (ref 0.0–1.2)
CO2: 18 mmol/L — ABNORMAL LOW (ref 20–29)
Calcium: 9.2 mg/dL (ref 8.6–10.2)
Chloride: 98 mmol/L (ref 96–106)
Creatinine, Ser: 1.03 mg/dL (ref 0.76–1.27)
Globulin, Total: 2.2 g/dL (ref 1.5–4.5)
Glucose: 112 mg/dL — ABNORMAL HIGH (ref 70–99)
Potassium: 4.7 mmol/L (ref 3.5–5.2)
Sodium: 133 mmol/L — ABNORMAL LOW (ref 134–144)
Total Protein: 6.9 g/dL (ref 6.0–8.5)
eGFR: 83 mL/min/{1.73_m2} (ref >59)

## 2023-08-17 LAB — LIPID PANEL
Chol/HDL Ratio: 3.1 {ratio} (ref 0.0–5.0)
Cholesterol, Total: 206 mg/dL — ABNORMAL HIGH (ref 100–199)
HDL: 66 mg/dL (ref >39)
LDL Chol Calc (NIH): 123 mg/dL — ABNORMAL HIGH (ref 0–99)
Triglycerides: 95 mg/dL (ref 0–149)
VLDL Cholesterol Cal: 17 mg/dL (ref 5–40)

## 2023-08-17 LAB — CBC WITH DIFFERENTIAL/PLATELET
Basophils Absolute: 0.1 10*3/uL (ref 0.0–0.2)
Basos: 1 %
EOS (ABSOLUTE): 0.1 10*3/uL (ref 0.0–0.4)
Eos: 1 %
Hematocrit: 52.6 % — ABNORMAL HIGH (ref 37.5–51.0)
Hemoglobin: 18 g/dL — ABNORMAL HIGH (ref 13.0–17.7)
Immature Grans (Abs): 0.1 10*3/uL (ref 0.0–0.1)
Immature Granulocytes: 1 %
Lymphocytes Absolute: 2.1 10*3/uL (ref 0.7–3.1)
Lymphs: 23 %
MCH: 33.7 pg — ABNORMAL HIGH (ref 26.6–33.0)
MCHC: 34.2 g/dL (ref 31.5–35.7)
MCV: 99 fL — ABNORMAL HIGH (ref 79–97)
Monocytes Absolute: 0.5 10*3/uL (ref 0.1–0.9)
Monocytes: 5 %
Neutrophils Absolute: 6.5 10*3/uL (ref 1.4–7.0)
Neutrophils: 69 %
Platelets: 142 10*3/uL — ABNORMAL LOW (ref 150–450)
RBC: 5.34 x10E6/uL (ref 4.14–5.80)
RDW: 12.6 % (ref 11.6–15.4)
WBC: 9.3 10*3/uL (ref 3.4–10.8)

## 2023-08-19 ENCOUNTER — Encounter: Payer: Self-pay | Admitting: Medical-Surgical

## 2023-08-19 DIAGNOSIS — E1169 Type 2 diabetes mellitus with other specified complication: Secondary | ICD-10-CM

## 2023-08-19 DIAGNOSIS — D582 Other hemoglobinopathies: Secondary | ICD-10-CM

## 2023-08-19 DIAGNOSIS — E871 Hypo-osmolality and hyponatremia: Secondary | ICD-10-CM

## 2023-08-22 MED ORDER — ROSUVASTATIN CALCIUM 20 MG PO TABS: 20.0000 mg | ORAL_TABLET | Freq: Every day | ORAL | 3 refills | Status: DC

## 2023-08-30 ENCOUNTER — Telehealth: Payer: Self-pay

## 2023-08-30 NOTE — Telephone Encounter (Signed)
 Copied from CRM [id number]. Topic: General - Other >> Aug 29, 2023 12:57 PM Carlatta H wrote: Reason for CRM: Jackolyn calling on behalf of [employer]//Authorizing ordered to be performed at [place] // Authorization code is  [id number]//Case number is [id number]

## 2023-09-01 ENCOUNTER — Ambulatory Visit: Payer: Commercial Managed Care - HMO

## 2023-09-01 DIAGNOSIS — F17210 Nicotine dependence, cigarettes, uncomplicated: Secondary | ICD-10-CM

## 2023-09-01 DIAGNOSIS — F17200 Nicotine dependence, unspecified, uncomplicated: Secondary | ICD-10-CM

## 2023-09-01 DIAGNOSIS — Z122 Encounter for screening for malignant neoplasm of respiratory organs: Secondary | ICD-10-CM | POA: Diagnosis not present

## 2023-09-11 ENCOUNTER — Encounter: Payer: Self-pay | Admitting: Medical-Surgical

## 2023-09-11 ENCOUNTER — Other Ambulatory Visit: Payer: Self-pay | Admitting: Medical-Surgical

## 2023-09-11 DIAGNOSIS — I2510 Atherosclerotic heart disease of native coronary artery without angina pectoris: Secondary | ICD-10-CM

## 2023-09-12 ENCOUNTER — Encounter: Payer: Self-pay | Admitting: Medical-Surgical

## 2023-09-16 ENCOUNTER — Other Ambulatory Visit: Payer: Self-pay | Admitting: Medical-Surgical

## 2023-09-16 DIAGNOSIS — Z20828 Contact with and (suspected) exposure to other viral communicable diseases: Secondary | ICD-10-CM

## 2023-09-16 MED ORDER — OSELTAMIVIR PHOSPHATE 75 MG PO CAPS: 75.0000 mg | ORAL_CAPSULE | Freq: Two times a day (BID) | ORAL | 0 refills | Status: DC

## 2023-11-09 ENCOUNTER — Ambulatory Visit: Payer: Commercial Managed Care - HMO | Admitting: Internal Medicine

## 2023-11-22 ENCOUNTER — Ambulatory Visit: Payer: Commercial Managed Care - HMO | Attending: Cardiology | Admitting: Cardiology

## 2023-11-22 ENCOUNTER — Encounter: Payer: Self-pay | Admitting: Cardiology

## 2023-11-22 VITALS — BP 136/80 | HR 60 | Ht 68.0 in | Wt 195.0 lb

## 2023-11-22 DIAGNOSIS — E1165 Type 2 diabetes mellitus with hyperglycemia: Secondary | ICD-10-CM | POA: Diagnosis not present

## 2023-11-22 DIAGNOSIS — I10 Essential (primary) hypertension: Secondary | ICD-10-CM | POA: Diagnosis not present

## 2023-11-22 DIAGNOSIS — I700 Atherosclerosis of aorta: Secondary | ICD-10-CM | POA: Diagnosis not present

## 2023-11-22 DIAGNOSIS — J439 Emphysema, unspecified: Secondary | ICD-10-CM | POA: Diagnosis not present

## 2023-11-22 DIAGNOSIS — I739 Peripheral vascular disease, unspecified: Secondary | ICD-10-CM

## 2023-11-22 DIAGNOSIS — E785 Hyperlipidemia, unspecified: Secondary | ICD-10-CM

## 2023-11-22 HISTORY — DX: Hyperlipidemia, unspecified: E78.5

## 2023-11-22 HISTORY — DX: Peripheral vascular disease, unspecified: I73.9

## 2023-11-22 MED ORDER — VARENICLINE TARTRATE (STARTER) 0.5 MG X 11 & 1 MG X 42 PO TBPK: 1.0000 | ORAL_TABLET | ORAL | 0 refills | Status: AC

## 2023-11-22 MED ORDER — ROSUVASTATIN CALCIUM 40 MG PO TABS: 40.0000 mg | ORAL_TABLET | Freq: Every day | ORAL | 3 refills | Status: DC

## 2023-11-22 NOTE — Progress Notes (Signed)
 Cardiology Consultation:    Date:  11/22/2023   ID:  Spencer Bishop, DOB 01-09-1961, MRN [id number]  PCP:  Christen Butter, NP  Cardiologist:  Gypsy Balsam, MD   Referring MD: Christen Butter, NP   Chief Complaint  Patient presents with   Establish Care   Abnormal CT    History of Present Illness:    Spencer Bishop is a 63 y.o. male who is being seen today for the evaluation of calcification of the coronary arteries at the request of Christen Butter, NP.  Past medical history significant for chronic smoking, diabetes type 2, well-controlled, dyslipidemia, history of DVT 2 years ago, hypertension, alcohol abuse.  He was referred to me because stress CT showed calcification of the coronary artery which appears to be severe.  He denies have any symptoms there is no chest pain tightness squeezing pressure burning chest, but his ability to exercise is limited because of shortness of breath which is undoubtedly related to quite advanced COPD.  He smokes about 1-1/2 pack/day.  She did have a CT of his chest done as follow-up for some pulmonary nodule.  He also described to have pain in both of his legs and calf and thighs when he walks.  When he stands he is fine when he walks still having pain.  Stops pain goes away.  He has never had any cardiac evaluation, he is not on any special diet, he does not exercise on the regular basis, he works as a Estate agent.  Does have family history of premature coronary artery disease.  Past Medical History:  Diagnosis Date   Alcohol use    Diabetes mellitus without complication (HCC)    DVT (deep venous thrombosis) (HCC)    Hypertension     Past Surgical History:  Procedure Laterality Date   RECONSTRUCTION OF NOSE      Current Medications: Current Meds  Medication Sig   albuterol (VENTOLIN HFA) 108 (90 Base) MCG/ACT inhaler Inhale 2 puffs into the lungs every 6 (six) hours as needed for wheezing or cough.   aspirin EC 81 MG tablet Take 81 mg by mouth  daily. Swallow whole.   blood glucose meter kit and supplies KIT Dispense based on patient and insurance preference. Use up to four times daily as directed. Please include lancets, test strips, control solution. (Patient taking differently: Inject 1 each into the skin as directed. Dispense based on patient and insurance preference. Use up to four times daily as directed. Please include lancets, test strips, control solution.)   glucose blood (FREESTYLE LITE) test strip Dx DM E11.9 Check fasting blood sugar daily and 2 hours after largest meal 2 times a week. (Patient taking differently: 1 each by Other route See admin instructions. Dx DM E11.9 Check fasting blood sugar daily and 2 hours after largest meal 2 times a week.)   Lancets (FREESTYLE) lancets Dx DM E11.9 Check fasting blood sugar daily and 2 hours after largest meal 2 times a week. (Patient taking differently: 1 each by Other route See admin instructions. Dx DM E11.9 Check fasting blood sugar daily and 2 hours after largest meal 2 times a week.)   metFORMIN (GLUCOPHAGE XR) 500 MG 24 hr tablet Take 1 tablet (500 mg total) by mouth daily with breakfast.   rosuvastatin (CRESTOR) 20 MG tablet Take 1 tablet (20 mg total) by mouth daily.   sildenafil (REVATIO) 20 MG tablet Take 1-5 tablets (20-100 mg total) by mouth as needed. (Patient taking differently: Take  20-100 mg by mouth as needed (ED).)   traMADol (ULTRAM) 50 MG tablet Take 1 tablet (50 mg total) by mouth daily as needed. (Patient taking differently: Take 50 mg by mouth daily as needed for moderate pain (pain score 4-6) or severe pain (pain score 7-10).)   valsartan (DIOVAN) 80 MG tablet Take 1 tablet (80 mg total) by mouth daily.   [DISCONTINUED] oseltamivir (TAMIFLU) 75 MG capsule Take 1 capsule (75 mg total) by mouth 2 (two) times daily.     Allergies:   Tylenol [acetaminophen]   Social History   Socioeconomic History   Marital status: Married    Spouse name: Not on file   Number of  children: Not on file   Years of education: Not on file   Highest education level: GED or equivalent  Occupational History   Not on file  Tobacco Use   Smoking status: Every Day    Current packs/day: 1.50    Types: Cigarettes   Smokeless tobacco: Never  Vaping Use   Vaping status: Never Used  Substance and Sexual Activity   Alcohol use: Yes    Alcohol/week: 30.0 standard drinks of alcohol    Types: 30 Standard drinks or equivalent per week   Drug use: Yes    Frequency: 7.0 times per week    Types: Marijuana    Comment: daily use   Sexual activity: Yes    Partners: Female    Birth control/protection: Surgical  Other Topics Concern   Not on file  Social History Narrative   Not on file   Social Drivers of Health   Financial Resource Strain: Low Risk  (08/11/2023)   Overall Financial Resource Strain (CARDIA)    Difficulty of Paying Living Expenses: Not hard at all  Food Insecurity: No Food Insecurity (08/11/2023)   Hunger Vital Sign    Worried About Running Out of Food in the Last Year: Never true    Ran Out of Food in the Last Year: Never true  Transportation Needs: No Transportation Needs (08/11/2023)   PRAPARE - Administrator, Civil Service (Medical): No    Lack of Transportation (Non-Medical): No  Physical Activity: Sufficiently Active (08/11/2023)   Exercise Vital Sign    Days of Exercise per Week: 3 days    Minutes of Exercise per Session: 60 min  Stress: No Stress Concern Present (08/11/2023)   [employer] of Occupational Health - Occupational Stress Questionnaire    Feeling of Stress : Only a little  Social Connections: Moderately Isolated (08/11/2023)   Social Connection and Isolation Panel [NHANES]    Frequency of Communication with Friends and Family: Twice a week    Frequency of Social Gatherings with Friends and Family: Twice a week    Attends Religious Services: Never    Database administrator or Organizations: No    Attends Museum/gallery exhibitions officer: Not on file    Marital Status: Married     Family History: The patient's family history includes Diabetes in his mother; Heart attack in his father and mother. ROS:   Please see the history of present illness.    All 14 point review of systems negative except as described per history of present illness.  EKGs/Labs/Other Studies Reviewed:    The following studies were reviewed today: CT of the chest shows severe calcification of 3 coronary artery   EKG:  EKG Interpretation Date/Time:  Tuesday November 22 2023 09:07:01 EDT Ventricular Rate:  60 PR Interval:  148 QRS Duration:  106 QT Interval:  404 QTC Calculation: 404 R Axis:   -68  Text Interpretation: Sinus rhythm with Premature atrial complexes in a pattern of bigeminy Left anterior fascicular block No previous ECGs available Confirmed by Gypsy Balsam [phone]) on 11/22/2023 9:11:53 AM    Recent Labs: 08/16/2023: ALT 24; BUN 14; Creatinine, Ser 1.03; Hemoglobin 18.0; Platelets 142; Potassium 4.7; Sodium 133  Recent Lipid Panel    Component Value Date/Time   CHOL 206 (H) 08/16/2023 0956   TRIG 95 08/16/2023 0956   HDL 66 08/16/2023 0956   CHOLHDL 3.1 08/16/2023 0956   CHOLHDL 2.3 06/30/2022 0000   VLDL 29 07/12/2007 0012   LDLCALC 123 (H) 08/16/2023 0956   LDLCALC 70 06/30/2022 0000    Physical Exam:    VS:  BP 136/80 (BP Location: Right Arm, Patient Position: Sitting)   Pulse 60   Ht 5\' 8"  (1.727 m)   Wt 195 lb (88.5 kg)   SpO2 91%   BMI 29.65 kg/m     Wt Readings from Last 3 Encounters:  11/22/23 195 lb (88.5 kg)  09/01/23 190 lb (86.2 kg)  08/16/23 186 lb 11.2 oz (84.7 kg)     GEN:  Well nourished, well developed in no acute distress HEENT: Normal NECK: No JVD; No carotid bruits LYMPHATICS: No lymphadenopathy CARDIAC: RRR, no murmurs, no rubs, no gallops RESPIRATORY:  Clear to auscultation without rales, wheezing or rhonchi  ABDOMEN: Soft, non-tender,  non-distended MUSCULOSKELETAL:  No edema; No deformity  SKIN: Warm and dry NEUROLOGIC:  Alert and oriented x 3 PSYCHIATRIC:  Normal affect   ASSESSMENT:    1. Essential hypertension, benign   2. Aortic atherosclerosis (HCC)   3. Pulmonary emphysema, unspecified emphysema type (HCC)   4. Type 2 diabetes mellitus with hyperglycemia, without long-term current use of insulin (HCC)   5. Dyslipidemia   6. Claudication [facility])    PLAN:    In order of problems listed above:  I suspect diffuse advanced atherosclerosis.  Coronary appears to be significantly calcified.  Likely he does not have symptoms but I am afraid that his symptoms are not there since he is COPD prevent him from reaching the level of exercise that can provoke some symptomatology.  He is already on antiplatelet therapy which I will continue.  Will schedule him PET stress test.  I am worried that he may have triple-vessel disease that is why no regular stress test. Claudications.  I will schedule him to have arterial duplex evaluation of lower extremities. Again I suspect advanced atherosclerosis.  Will schedule him to have carotic ultrasounds make sure he does not have any significant stenosis. Dyslipidemia I did review K PN LDL 123 HDL 66.  He is taking [facility] clearly not well-controlled cholesterol.  I will double the dose of Crestor to 40 mg Target LDL will be less than 70. Smoking we have a long discussion about this I strongly recommended to quit.  Will give him prescription for Chantix. Essential hypertension seems to be well-controlled.   Medication Adjustments/Labs and Tests Ordered: Current medicines are reviewed at length with the patient today.  Concerns regarding medicines are outlined above.  Orders Placed This Encounter  Procedures   EKG 12-Lead   No orders of the defined types were placed in this encounter.   Signed, Georgeanna Lea, MD, [facility]. 11/22/2023 9:30 AM    [facility]  [facility]

## 2023-11-22 NOTE — Addendum Note (Signed)
 Addended by: Baldo Ash D on: 11/22/2023 09:57 AM   Modules accepted: Orders

## 2023-11-22 NOTE — Addendum Note (Signed)
 Addended by: Baldo Ash D on: 11/22/2023 10:00 AM   Modules accepted: Orders

## 2023-11-22 NOTE — Patient Instructions (Addendum)
 Medication Instructions:   INCREASE: Crestor to 40mg  daily  START: Chantix   Lab Work: 3rd Floor   [address]  Your physician recommends that you return for lab work in:   6 weeks - fasting You need to have labs done when you are fasting.  You can come Monday through Friday 8:00 am to 11:30AM and 1:00 to 4:00. You do not need to make an appointment as the order has already been placed.   Testing/Procedures:  Your physician has requested that you have a lower or upper extremity arterial duplex. This test is an ultrasound of the arteries in the legs or arms. It looks at arterial blood flow in the legs and arms. Allow one hour for Lower and Upper Arterial scans. There are no restrictions or special instructions.  Please note: We ask at that you not bring children with you during ultrasound (echo/ vascular) testing. Due to room size and safety concerns, children are not allowed in the ultrasound rooms during exams. Our front office staff cannot provide observation of children in our lobby area while testing is being conducted. An adult accompanying a patient to their appointment will only be allowed in the ultrasound room at the discretion of the ultrasound technician under special circumstances. We apologize for any inconvenience.   Your physician has requested that you have a carotid duplex. This test is an ultrasound of the carotid arteries in your neck. It looks at blood flow through these arteries that supply the brain with blood. Allow one hour for this exam. There are no restrictions or special instructions.  How to Prepare for Your Cardiac PET/CT Stress Test:  1. Please do not take these medications before your test:   Medications that may interfere with the cardiac pharmacological stress agent (ex. nitrates - including erectile dysfunction medications, isosorbide mononitrate- [please start to hold this medication the day before the test], tamulosin or beta-blockers) the day of the exam.  (Erectile dysfunction medication should be held for at least 72 hrs prior to test) Theophylline containing medications for 12 hours. Dipyridamole 48 hours prior to the test. Your remaining medications may be taken with water.  2. Nothing to eat or drink, except water, 3 hours prior to arrival time.   NO caffeine/decaffeinated products, or chocolate 12 hours prior to arrival.  3. NO perfume, cologne or lotion on chest or abdomen area.          - FEMALES - Please avoid wearing dresses to this appointment.  4. Total time is 1 to 2 hours; you may want to bring reading material for the waiting time.  5. Please report to Radiology at the [facility] Main Entrance 30 minutes early for your test.  [address] [place], [place] [address]      Diabetic Preparation:  Hold oral medications. You may take NPH and Lantus insulin. Do not take Humalog or Humulin R (Regular Insulin) the day of your test. Check blood sugars prior to leaving the house. If able to eat breakfast prior to 3 hour fasting, you may take all medications, including your insulin, Do not worry if you miss your breakfast dose of insulin - start at your next meal. Patients who wear a continuous glucose monitor MUST remove the device prior to scanning.    In preparation for your appointment, medication and supplies will be purchased.  Appointment availability is limited, so if you need to cancel or reschedule, please call the Radiology Department at [phone] [facility])  OR [phone] [facility])  24 hours in advance to avoid a cancellation fee of $100.00  What to Expect After you Arrive:  Once you arrive and check in for your appointment, you will be taken to a preparation room within the Radiology Department.  A technologist or Nurse will obtain your medical history, verify that you are correctly prepped for the exam, and explain the procedure.  Afterwards,  an IV will be started in your arm and electrodes will be  placed on your skin for EKG monitoring during the stress portion of the exam. Then you will be escorted to the PET/CT scanner.  There, staff will get you positioned on the scanner and obtain a blood pressure and EKG.  During the exam, you will continue to be connected to the EKG and blood pressure machines.  A small, safe amount of a radioactive tracer will be injected in your IV to obtain a series of pictures of your heart along with an injection of a stress agent.    After your Exam:  It is recommended that you eat a meal and drink a caffeinated beverage to counter act any effects of the stress agent.  Drink plenty of fluids for the remainder of the day and urinate frequently for the first couple of hours after the exam.  Your doctor will inform you of your test results within 7-10 business days.  For more information and frequently asked questions, please visit our website : [email or web address]  For questions about your test or how to prepare for your test, please call: Cardiac Imaging Nurse Navigators Office: [phone]    Follow-Up: At [facility], you and your health needs are our priority.  As part of our continuing mission to provide you with exceptional heart care, we have created designated Provider Care Teams.  These Care Teams include your primary Cardiologist (physician) and Advanced Practice Providers (APPs -  Physician Assistants and Nurse Practitioners) who all work together to provide you with the care you need, when you need it.  We recommend signing up for the patient portal called "MyChart".  Sign up information is provided on this After Visit Summary.  MyChart is used to connect with patients for Virtual Visits (Telemedicine).  Patients are able to view lab/test results, encounter notes, upcoming appointments, etc.  Non-urgent messages can be sent to your provider as well.   To learn more about what you can do with MyChart, go to  [email or web address].    Your next appointment:   2 month(s)  The format for your next appointment:   In Person  Provider:   Gypsy Balsam, MD    Other Instructions none  Important Information About Sugar

## 2023-11-28 ENCOUNTER — Other Ambulatory Visit: Payer: Self-pay | Admitting: Cardiology

## 2023-11-28 ENCOUNTER — Ambulatory Visit (HOSPITAL_BASED_OUTPATIENT_CLINIC_OR_DEPARTMENT_OTHER)
Admission: RE | Admit: 2023-11-28 | Discharge: 2023-11-28 | Disposition: A | Discharge status: Home / Self Care | Source: Ambulatory Visit | Attending: Cardiology | Admitting: Cardiology

## 2023-11-28 DIAGNOSIS — J439 Emphysema, unspecified: Secondary | ICD-10-CM

## 2023-11-28 DIAGNOSIS — I6523 Occlusion and stenosis of bilateral carotid arteries: Secondary | ICD-10-CM

## 2023-11-28 DIAGNOSIS — I700 Atherosclerosis of aorta: Secondary | ICD-10-CM

## 2023-11-28 DIAGNOSIS — I739 Peripheral vascular disease, unspecified: Secondary | ICD-10-CM

## 2023-11-28 DIAGNOSIS — I10 Essential (primary) hypertension: Secondary | ICD-10-CM

## 2023-11-28 DIAGNOSIS — E785 Hyperlipidemia, unspecified: Secondary | ICD-10-CM

## 2023-11-28 DIAGNOSIS — E1165 Type 2 diabetes mellitus with hyperglycemia: Secondary | ICD-10-CM

## 2023-11-28 NOTE — Addendum Note (Signed)
 Addended by: Ralene Burger on: 11/28/2023 01:56 PM   Modules accepted: Orders

## 2023-11-30 ENCOUNTER — Telehealth: Payer: Self-pay

## 2023-11-30 NOTE — Telephone Encounter (Signed)
 Results reviewed with pt's spouse Ammon Bales per DPR as per Dr. Tonja Fray note. Spouse verbalized understanding and had no additional questions. Routed to PCP

## 2023-12-15 ENCOUNTER — Ambulatory Visit (HOSPITAL_BASED_OUTPATIENT_CLINIC_OR_DEPARTMENT_OTHER)

## 2024-01-10 ENCOUNTER — Ambulatory Visit (HOSPITAL_BASED_OUTPATIENT_CLINIC_OR_DEPARTMENT_OTHER)
Admission: RE | Admit: 2024-01-10 | Discharge: 2024-01-10 | Disposition: A | Discharge status: Home / Self Care | Source: Ambulatory Visit | Attending: Cardiology | Admitting: Cardiology

## 2024-01-10 DIAGNOSIS — I739 Peripheral vascular disease, unspecified: Secondary | ICD-10-CM | POA: Diagnosis present

## 2024-01-10 LAB — VAS US ABI WITH/WO TBI
Left ABI: 0.72
Right ABI: 0.79

## 2024-01-12 ENCOUNTER — Ambulatory Visit: Payer: Self-pay | Admitting: Cardiology

## 2024-01-23 ENCOUNTER — Encounter: Payer: Self-pay | Admitting: Cardiology

## 2024-01-24 ENCOUNTER — Ambulatory Visit: Attending: Cardiology | Admitting: Cardiology

## 2024-01-24 ENCOUNTER — Encounter: Payer: Self-pay | Admitting: Cardiology

## 2024-01-24 VITALS — BP 116/78 | HR 63 | Ht 68.0 in | Wt 191.0 lb

## 2024-01-24 DIAGNOSIS — J439 Emphysema, unspecified: Secondary | ICD-10-CM | POA: Diagnosis not present

## 2024-01-24 DIAGNOSIS — I739 Peripheral vascular disease, unspecified: Secondary | ICD-10-CM

## 2024-01-24 DIAGNOSIS — E785 Hyperlipidemia, unspecified: Secondary | ICD-10-CM

## 2024-01-24 DIAGNOSIS — I700 Atherosclerosis of aorta: Secondary | ICD-10-CM

## 2024-01-24 DIAGNOSIS — I10 Essential (primary) hypertension: Secondary | ICD-10-CM | POA: Diagnosis not present

## 2024-01-24 NOTE — Addendum Note (Signed)
 Addended by: Shawnee Dellen D on: 01/24/2024 10:09 AM   Modules accepted: Orders

## 2024-01-24 NOTE — Patient Instructions (Signed)
 Medication Instructions:  Your physician recommends that you continue on your current medications as directed. Please refer to the Current Medication list given to you today.  *If you need a refill on your cardiac medications before your next appointment, please call your pharmacy*   Lab Work: 3rd Floor   [address]  Your physician recommends that you return for lab work in:    You need to have labs done when you are fasting.  You can come Monday through Friday 8:00 am to 11:30AM and 1:00 to 4:00. You do not need to make an appointment as the order has already been placed.   Testing/Procedures: None Ordered   Follow-Up: At [facility], you and your health needs are our priority.  As part of our continuing mission to provide you with exceptional heart care, we have created designated Provider Care Teams.  These Care Teams include your primary Cardiologist (physician) and Advanced Practice Providers (APPs -  Physician Assistants and Nurse Practitioners) who all work together to provide you with the care you need, when you need it.  We recommend signing up for the patient portal called "MyChart".  Sign up information is provided on this After Visit Summary.  MyChart is used to connect with patients for Virtual Visits (Telemedicine).  Patients are able to view lab/test results, encounter notes, upcoming appointments, etc.  Non-urgent messages can be sent to your provider as well.   To learn more about what you can do with MyChart, go to [email or web address].    Your next appointment:   2 month(s)  The format for your next appointment:   In Person  Provider:   Ralene Burger, MD    Other Instructions NA

## 2024-01-24 NOTE — Progress Notes (Signed)
 Cardiology Office Note:    Date:  01/24/2024   ID:  Spencer Bishop, DOB 1961-02-10, MRN [id number]  PCP:  Cherre Cornish, NP  Cardiologist:  Ralene Burger, MD    Referring MD: Cherre Cornish, NP   Chief Complaint  Patient presents with   Results    History of Present Illness:    Spencer Bishop is a 63 y.o. male past medical history significant for current smoking, COPD, type 2 diabetes, dyslipidemia, history of alcohol abuse.  Comes today to months for follow-up.  He was referred to me because of concern about diffuse atherosclerosis.  So far we got carotic ultrasounds which show up to 80% stenosis in the left intracardiac artery, we do have arterial duplex evaluation of lower extremity which showed both SFA being occluded.  I am still waiting for results of the heart assessment which will be done in form of PET scan 8 schedule next month.  Comes today 2 months for follow-up provide he said he is doing well.  Short of breath with exercise, denies having much of claudication with more normal walk with not bring it up when he tried to do to be more than he will develop some pain in his calfs.  Said he still continue to smoke but trying to reduce amount of cigarettes.  Increased dose of Crestor  as advised.  No chest pain tightness squeezing pressure burning chest  Past Medical History:  Diagnosis Date   Alcohol use    Diabetes mellitus without complication (HCC)    DVT (deep venous thrombosis) (HCC)    Hypertension     Past Surgical History:  Procedure Laterality Date   RECONSTRUCTION OF NOSE      Current Medications: Current Meds  Medication Sig   albuterol (VENTOLIN HFA) 108 (90 Base) MCG/ACT inhaler Inhale 2 puffs into the lungs every 6 (six) hours as needed for wheezing or cough.   aspirin EC 81 MG tablet Take 81 mg by mouth daily. Swallow whole.   blood glucose meter kit and supplies KIT Dispense based on patient and insurance preference. Use up to four times daily as directed.  Please include lancets, test strips, control solution. (Patient taking differently: Inject 1 each into the skin as directed. Dispense based on patient and insurance preference. Use up to four times daily as directed. Please include lancets, test strips, control solution.)   glucose blood (FREESTYLE LITE) test strip Dx DM E11.9 Check fasting blood sugar daily and 2 hours after largest meal 2 times a week. (Patient taking differently: 1 each by Other route See admin instructions. Dx DM E11.9 Check fasting blood sugar daily and 2 hours after largest meal 2 times a week.)   Lancets (FREESTYLE) lancets Dx DM E11.9 Check fasting blood sugar daily and 2 hours after largest meal 2 times a week. (Patient taking differently: 1 each by Other route See admin instructions. Dx DM E11.9 Check fasting blood sugar daily and 2 hours after largest meal 2 times a week.)   metFORMIN  (GLUCOPHAGE  XR) 500 MG 24 hr tablet Take 1 tablet (500 mg total) by mouth daily with breakfast.   rosuvastatin  (CRESTOR ) 40 MG tablet Take 1 tablet (40 mg total) by mouth daily.   sildenafil  (REVATIO ) 20 MG tablet Take 1-5 tablets (20-100 mg total) by mouth as needed. (Patient taking differently: Take 20-100 mg by mouth as needed (ED).)   traMADol  (ULTRAM ) 50 MG tablet Take 1 tablet (50 mg total) by mouth daily as needed. (Patient taking differently:  Take 50 mg by mouth daily as needed for moderate pain (pain score 4-6) or severe pain (pain score 7-10).)   valsartan  (DIOVAN ) 80 MG tablet Take 1 tablet (80 mg total) by mouth daily.   Varenicline  Tartrate, Starter, (CHANTIX  STARTING MONTH PAK) 0.5 MG X 11 & 1 MG X 42 TBPK Take 1 tablet by mouth as directed.     Allergies:   Tylenol [acetaminophen]   Social History   Socioeconomic History   Marital status: Married    Spouse name: Not on file   Number of children: Not on file   Years of education: Not on file   Highest education level: GED or equivalent  Occupational History   Not on file   Tobacco Use   Smoking status: Every Day    Current packs/day: 1.50    Types: Cigarettes   Smokeless tobacco: Never  Vaping Use   Vaping status: Never Used  Substance and Sexual Activity   Alcohol use: Yes    Alcohol/week: 30.0 standard drinks of alcohol    Types: 30 Standard drinks or equivalent per week   Drug use: Yes    Frequency: 7.0 times per week    Types: Marijuana    Comment: daily use   Sexual activity: Yes    Partners: Female    Birth control/protection: Surgical  Other Topics Concern   Not on file  Social History Narrative   Not on file   Social Drivers of Health   Financial Resource Strain: Low Risk  (08/11/2023)   Overall Financial Resource Strain (CARDIA)    Difficulty of Paying Living Expenses: Not hard at all  Food Insecurity: No Food Insecurity (08/11/2023)   Hunger Vital Sign    Worried About Running Out of Food in the Last Year: Never true    Ran Out of Food in the Last Year: Never true  Transportation Needs: No Transportation Needs (08/11/2023)   PRAPARE - Administrator, Civil Service (Medical): No    Lack of Transportation (Non-Medical): No  Physical Activity: Sufficiently Active (08/11/2023)   Exercise Vital Sign    Days of Exercise per Week: 3 days    Minutes of Exercise per Session: 60 min  Stress: No Stress Concern Present (08/11/2023)   [employer] of Occupational Health - Occupational Stress Questionnaire    Feeling of Stress : Only a little  Social Connections: Moderately Isolated (08/11/2023)   Social Connection and Isolation Panel [NHANES]    Frequency of Communication with Friends and Family: Twice a week    Frequency of Social Gatherings with Friends and Family: Twice a week    Attends Religious Services: Never    Database administrator or Organizations: No    Attends Engineer, structural: Not on file    Marital Status: Married     Family History: The patient's family history includes Diabetes in his  mother; Heart attack in his father and mother. ROS:   Please see the history of present illness.    All 14 point review of systems negative except as described per history of present illness  EKGs/Labs/Other Studies Reviewed:         Recent Labs: 08/16/2023: ALT 24; BUN 14; Creatinine, Ser 1.03; Hemoglobin 18.0; Platelets 142; Potassium 4.7; Sodium 133  Recent Lipid Panel    Component Value Date/Time   CHOL 206 (H) 08/16/2023 0956   TRIG 95 08/16/2023 0956   HDL 66 08/16/2023 0956   CHOLHDL 3.1 08/16/2023 5409  CHOLHDL 2.3 06/30/2022 0000   VLDL 29 07/12/2007 0012   LDLCALC 123 (H) 08/16/2023 0956   LDLCALC 70 06/30/2022 0000    Physical Exam:    VS:  BP 116/78 (BP Location: Right Arm, Patient Position: Sitting)   Pulse 63   Ht 5\' 8"  (1.727 m)   Wt 191 lb (86.6 kg)   SpO2 98%   BMI 29.04 kg/m     Wt Readings from Last 3 Encounters:  01/24/24 191 lb (86.6 kg)  11/22/23 195 lb (88.5 kg)  09/01/23 190 lb (86.2 kg)     GEN:  Well nourished, well developed in no acute distress HEENT: Normal NECK: No JVD; No carotid bruits LYMPHATICS: No lymphadenopathy CARDIAC: RRR, no murmurs, no rubs, no gallops RESPIRATORY:  Clear to auscultation without rales, wheezing or rhonchi  ABDOMEN: Soft, non-tender, non-distended MUSCULOSKELETAL:  No edema; No deformity  SKIN: Warm and dry LOWER EXTREMITIES: no swelling NEUROLOGIC:  Alert and oriented x 3 PSYCHIATRIC:  Normal affect   ASSESSMENT:    1. Peripheral vascular disease, unspecified (HCC)   2. Essential hypertension, benign   3. Aortic atherosclerosis (HCC)   4. Pulmonary emphysema, unspecified emphysema type (HCC)   5. Dyslipidemia    PLAN:    In order of problems listed above:  Atherosclerosis peripheral vascular disease, 80% on the left carotic artery, no need to intervene mechanically, obviously risk modification is in order.  He is on antiplatelet therapy which I continue. Dyslipidemia last time I seen him I  increased dose of Crestor .  Will recheck fasting lipid profile. Peripheral vascular disease in form of obstruction both SFA.  We did talk about option potential surgery however his symptoms are relatively mild therefore I have encouraged to be active. I suspect he does have significant coronary disease even though he does not have any symptoms that would suggest any instability at this stage.  Awaiting results of PET scan. Smoking.  He spent at least 5 minutes talking about the need to quit he understand he will try to do the   Medication Adjustments/Labs and Tests Ordered: Current medicines are reviewed at length with the patient today.  Concerns regarding medicines are outlined above.  No orders of the defined types were placed in this encounter.  Medication changes: No orders of the defined types were placed in this encounter.   Signed, Manfred Seed, MD, [facility] 01/24/2024 9:54 AM    [facility]

## 2024-01-25 LAB — LIPID PANEL
Chol/HDL Ratio: 2.6 ratio (ref 0.0–5.0)
Cholesterol, Total: 127 mg/dL (ref 100–199)
HDL: 48 mg/dL (ref >39)
LDL Chol Calc (NIH): 61 mg/dL (ref 0–99)
Triglycerides: 92 mg/dL (ref 0–149)
VLDL Cholesterol Cal: 18 mg/dL (ref 5–40)

## 2024-01-25 LAB — ALT: ALT: 17 IU/L (ref 0–44)

## 2024-01-25 LAB — AST: AST: 18 IU/L (ref 0–40)

## 2024-01-27 ENCOUNTER — Ambulatory Visit: Payer: Self-pay | Admitting: Cardiology

## 2024-02-02 ENCOUNTER — Telehealth: Payer: Self-pay

## 2024-02-02 NOTE — Telephone Encounter (Signed)
 LVM to call regarding US  results

## 2024-02-08 ENCOUNTER — Other Ambulatory Visit: Payer: Self-pay | Admitting: Sports Medicine

## 2024-02-08 DIAGNOSIS — M1611 Unilateral primary osteoarthritis, right hip: Secondary | ICD-10-CM

## 2024-02-13 ENCOUNTER — Encounter: Payer: Self-pay | Admitting: Medical-Surgical

## 2024-02-13 ENCOUNTER — Ambulatory Visit (INDEPENDENT_AMBULATORY_CARE_PROVIDER_SITE_OTHER): Payer: Commercial Managed Care - HMO | Admitting: Medical-Surgical

## 2024-02-13 VITALS — BP 146/77 | HR 54 | Resp 20 | Ht 68.0 in | Wt 194.0 lb

## 2024-02-13 DIAGNOSIS — I10 Essential (primary) hypertension: Secondary | ICD-10-CM | POA: Diagnosis not present

## 2024-02-13 DIAGNOSIS — E1165 Type 2 diabetes mellitus with hyperglycemia: Secondary | ICD-10-CM | POA: Diagnosis not present

## 2024-02-13 DIAGNOSIS — E785 Hyperlipidemia, unspecified: Secondary | ICD-10-CM | POA: Diagnosis not present

## 2024-02-13 DIAGNOSIS — Z7984 Long term (current) use of oral hypoglycemic drugs: Secondary | ICD-10-CM

## 2024-02-13 DIAGNOSIS — J439 Emphysema, unspecified: Secondary | ICD-10-CM | POA: Diagnosis not present

## 2024-02-13 DIAGNOSIS — E1169 Type 2 diabetes mellitus with other specified complication: Secondary | ICD-10-CM

## 2024-02-13 HISTORY — DX: Type 2 diabetes mellitus with other specified complication: E11.69

## 2024-02-13 LAB — POCT GLYCOSYLATED HEMOGLOBIN (HGB A1C)
HbA1c, POC (controlled diabetic range): 5.6 % (ref 0.0–7.0)
Hemoglobin A1C: 5.6 % (ref 4.0–5.6)

## 2024-02-13 LAB — POCT UA - MICROALBUMIN
Albumin/Creatinine Ratio, Urine, POC: <30
Creatinine, POC: 50 mg/dL
Microalbumin Ur, POC: 10 mg/L

## 2024-02-13 NOTE — Progress Notes (Signed)
        Established patient visit  History, exam, impression, and plan:  1. Type 2 diabetes mellitus with hyperglycemia, without long-term current use of insulin [facility]) Pleasant 63 year old male presenting today with a history of type 2 diabetes.  He is currently taking metformin  XR 500 mg daily, tolerating well without side effects.  Following a diabetic diet and reports a weight loss of approximately 30 pounds since diagnosis.  States physically active at work but no outside exercise.  Prior hemoglobin A1c 6 months ago at 5.8%.  Recheck today at 5.6% showing excellent control.  Continue metformin  as prescribed.  Microalbumin normal today.  Due for diabetic eye exam and reports plan to have this scheduled soon. - POCT HgB A1C - POCT UA - Microalbumin  2. Hyperlipidemia associated with type 2 diabetes mellitus (HCC) (Primary) Currently taking Crestor  40 mg daily, tolerating well without side effects.  This was increased by cardiology and since taking the higher dose, his cholesterol has responded favorably.  Following a low-fat heart healthy diet.  Up-to-date on lipid checks.  3. Essential hypertension, benign History of hypertension currently treated with valsartan  80 mg daily, tolerating well without side effects.  Has a blood pressure cuff at home but has not been monitoring it recently.  Usually takes his medication at about 10:30 in the morning after eating breakfast.  Has not had his dose today.  Seen cardiology where they are continuing to do workup with a planned nuclear med cardiac scan in the next couple of weeks.  Following a low-sodium diet.  Activity as noted above.  Denies concerning symptoms today.  Cardiopulmonary exam is normal today.  Blood pressure elevated on arrival at 161/72.  Recheck at 146/77.  Continue valsartan  80 mg daily as prescribed.  Recommend ambulatory monitoring at home to determine if blood pressures are remaining elevated or if this is an isolated situation.  Checking  labs as below. - CBC - CMP14+EGFR  4. Pulmonary emphysema, unspecified emphysema type (HCC) History of cigarette smoking long-term.  Previously was prescribed Chantix  which he has at home but has not started yet.  He is interested in smoking cessation but has not attempted quitting.  He has been cutting back which is progress.  He was found to have pulmonary emphysema but denies any dyspnea, coughing, or wheezing today.  Procedures performed this visit: None.  Return in about 6 months (around 08/14/2024) for chronic disease follow up.  __________________________________ Zada FREDRIK Palin, DNP, APRN, FNP-BC Primary Care and Sports Medicine [place] [facility]

## 2024-02-14 LAB — CMP14+EGFR
ALT: 14 IU/L (ref 0–44)
AST: 19 IU/L (ref 0–40)
Albumin: 4.5 g/dL (ref 3.9–4.9)
Alkaline Phosphatase: 165 IU/L — ABNORMAL HIGH (ref 44–121)
BUN/Creatinine Ratio: 13 (ref 10–24)
BUN: 12 mg/dL (ref 8–27)
Bilirubin Total: 0.4 mg/dL (ref 0.0–1.2)
CO2: 20 mmol/L (ref 20–29)
Calcium: 9.5 mg/dL (ref 8.6–10.2)
Chloride: 102 mmol/L (ref 96–106)
Creatinine, Ser: 0.89 mg/dL (ref 0.76–1.27)
Globulin, Total: 2.1 g/dL (ref 1.5–4.5)
Glucose: 118 mg/dL — ABNORMAL HIGH (ref 70–99)
Potassium: 4.9 mmol/L (ref 3.5–5.2)
Sodium: 138 mmol/L (ref 134–144)
Total Protein: 6.6 g/dL (ref 6.0–8.5)
eGFR: 97 mL/min/{1.73_m2} (ref >59)

## 2024-02-14 LAB — CBC
Hematocrit: 47.2 % (ref 37.5–51.0)
Hemoglobin: 15.8 g/dL (ref 13.0–17.7)
MCH: 32.6 pg (ref 26.6–33.0)
MCHC: 33.5 g/dL (ref 31.5–35.7)
MCV: 97 fL (ref 79–97)
Platelets: 126 10*3/uL — ABNORMAL LOW (ref 150–450)
RBC: 4.85 x10E6/uL (ref 4.14–5.80)
RDW: 12.8 % (ref 11.6–15.4)
WBC: 5.5 10*3/uL (ref 3.4–10.8)

## 2024-02-15 ENCOUNTER — Ambulatory Visit: Payer: Self-pay | Admitting: Medical-Surgical

## 2024-02-15 ENCOUNTER — Telehealth: Payer: Self-pay | Admitting: Cardiology

## 2024-02-15 NOTE — Telephone Encounter (Signed)
 Pt spouse called in stating pet scan was denied. She states they need additional documents on why he needs it. She states pt was not able to do exercise stress test due to his hip. She states they will have to cancel appt by 5pm today. She asked if there is any other documents they can submit or should pt discuss having a different test? Please advise.

## 2024-02-20 NOTE — Telephone Encounter (Signed)
 Resent message back to Pre cert regarding pts case for PET scan and to see if a Peer to Peer could be set up.

## 2024-02-20 NOTE — Telephone Encounter (Signed)
 Pt's wife Verneita is requesting a callback at [phone] regarding her wanting to f/u on CT scan being done and her speaking with insurance. She stated she was told a pee to peer or reconciliation would need to be done. Please advise

## 2024-02-21 ENCOUNTER — Ambulatory Visit (HOSPITAL_COMMUNITY)

## 2024-02-21 ENCOUNTER — Encounter: Payer: Self-pay | Admitting: Cardiology

## 2024-02-23 ENCOUNTER — Telehealth: Payer: Self-pay

## 2024-02-23 DIAGNOSIS — R0609 Other forms of dyspnea: Secondary | ICD-10-CM

## 2024-02-23 NOTE — Telephone Encounter (Signed)
 Due to PET being Denied by insurance. Dr. Krasowski recommended LexiScan. [facility] for [facility]

## 2024-02-27 ENCOUNTER — Telehealth (HOSPITAL_COMMUNITY): Payer: Self-pay

## 2024-02-27 ENCOUNTER — Other Ambulatory Visit: Payer: Self-pay | Admitting: Cardiology

## 2024-02-27 DIAGNOSIS — R0609 Other forms of dyspnea: Secondary | ICD-10-CM

## 2024-02-27 NOTE — Telephone Encounter (Signed)
 Spoke with he patient's wife per the DPR. Instructions given. S.Lamica Mccart CCT

## 2024-02-28 ENCOUNTER — Ambulatory Visit (HOSPITAL_COMMUNITY)
Admission: RE | Admit: 2024-02-28 | Discharge: 2024-02-28 | Disposition: A | Discharge status: Home / Self Care | Source: Ambulatory Visit | Attending: Internal Medicine | Admitting: Internal Medicine

## 2024-02-28 DIAGNOSIS — R0609 Other forms of dyspnea: Secondary | ICD-10-CM | POA: Diagnosis present

## 2024-02-28 LAB — MYOCARDIAL PERFUSION IMAGING
LV dias vol: 143 mL (ref 62–150)
LV sys vol: 52 mL (ref 4.2–5.8)
Nuc Stress EF: 64 %
Peak HR: 86 {beats}/min
Rest HR: 49 {beats}/min
Rest Nuclear Isotope Dose: 9.9 mCi
SDS: 1
SRS: 1
SSS: 2
ST Depression (mm): 0 mm
Stress Nuclear Isotope Dose: 30.5 mCi
TID: 1.1

## 2024-02-28 MED ORDER — TECHNETIUM TC 99M TETROFOSMIN IV KIT
30.5000 | PACK | Freq: Once | INTRAVENOUS | Status: AC | PRN
  Administered 2024-02-28: 30.5 via INTRAVENOUS

## 2024-02-28 MED ORDER — TECHNETIUM TC 99M TETROFOSMIN IV KIT
9.9000 | PACK | Freq: Once | INTRAVENOUS | Status: AC | PRN
  Administered 2024-02-28: 9.9 via INTRAVENOUS

## 2024-02-28 MED ORDER — REGADENOSON 0.4 MG/5ML IV SOLN
0.4000 mg | Freq: Once | INTRAVENOUS | Status: AC
  Administered 2024-02-28: 0.4 mg via INTRAVENOUS

## 2024-02-28 MED ORDER — REGADENOSON 0.4 MG/5ML IV SOLN
INTRAVENOUS | Status: AC
  Filled 2024-02-28: qty 5

## 2024-03-02 ENCOUNTER — Telehealth: Payer: Self-pay

## 2024-03-02 ENCOUNTER — Ambulatory Visit: Payer: Self-pay | Admitting: Cardiology

## 2024-03-02 NOTE — Telephone Encounter (Signed)
 Left message on My Chart with stress test results per Dr. Tonja Fray note. Routed to PCP.

## 2024-03-05 ENCOUNTER — Telehealth: Payer: Self-pay

## 2024-03-05 NOTE — Telephone Encounter (Signed)
 Pt viewed Stress Test results on My Chart per Dr. Vanetta Shawl note. Routed to PCP.

## 2024-03-08 ENCOUNTER — Telehealth: Payer: Self-pay

## 2024-03-08 ENCOUNTER — Ambulatory Visit: Payer: Self-pay | Admitting: Cardiology

## 2024-03-08 NOTE — Telephone Encounter (Signed)
 Left message on My Chart with stress test results per Dr. Tonja Fray note. Routed to PCP.

## 2024-03-12 ENCOUNTER — Ambulatory Visit
Admission: RE | Admit: 2024-03-12 | Discharge: 2024-03-12 | Disposition: A | Discharge status: Home / Self Care | Source: Ambulatory Visit | Attending: Family Medicine | Admitting: Family Medicine

## 2024-03-12 ENCOUNTER — Ambulatory Visit: Payer: Self-pay | Admitting: Medical-Surgical

## 2024-03-12 ENCOUNTER — Ambulatory Visit

## 2024-03-12 ENCOUNTER — Other Ambulatory Visit: Payer: Self-pay

## 2024-03-12 VITALS — BP 153/96 | HR 65 | Temp 98.3°F | Resp 16

## 2024-03-12 DIAGNOSIS — M51371 Other intervertebral disc degeneration, lumbosacral region with lower extremity pain only: Secondary | ICD-10-CM | POA: Diagnosis not present

## 2024-03-12 DIAGNOSIS — M5442 Lumbago with sciatica, left side: Secondary | ICD-10-CM | POA: Diagnosis not present

## 2024-03-12 DIAGNOSIS — M5432 Sciatica, left side: Secondary | ICD-10-CM | POA: Diagnosis not present

## 2024-03-12 HISTORY — DX: Peripheral vascular disease, unspecified: I73.9

## 2024-03-12 HISTORY — DX: Atherosclerotic heart disease of native coronary artery without angina pectoris: I25.10

## 2024-03-12 HISTORY — DX: Unspecified osteoarthritis, unspecified site: M19.90

## 2024-03-12 HISTORY — DX: Other forms of dyspnea: R06.09

## 2024-03-12 MED ORDER — KETOROLAC TROMETHAMINE 30 MG/ML IJ SOLN
30.0000 mg | Freq: Once | INTRAMUSCULAR | Status: AC
  Administered 2024-03-12: 30 mg via INTRAMUSCULAR

## 2024-03-12 MED ORDER — TIZANIDINE HCL 4 MG PO TABS: 4.0000 mg | ORAL_TABLET | Freq: Four times a day (QID) | ORAL | 0 refills | Status: DC | PRN

## 2024-03-12 MED ORDER — TRAMADOL HCL 50 MG PO TABS: 50.0000 mg | ORAL_TABLET | Freq: Three times a day (TID) | ORAL | 0 refills | Status: DC | PRN

## 2024-03-12 MED ORDER — METHYLPREDNISOLONE 4 MG PO TBPK: ORAL_TABLET | ORAL | 0 refills | Status: DC

## 2024-03-12 NOTE — ED Provider Notes (Signed)
 [facility]    CSN: [id number] Arrival date & time: 03/12/24  1006      History   Chief Complaint Chief Complaint  Patient presents with   Leg Pain    Entered by patient    HPI Spencer Bishop is a 63 y.o. male.   HPI Patient complains of left leg pain since last Thursday.  Spencer Bishop woke up with it Thursday morning.  Spencer Bishop tried to work Friday but it was really painful.  Got worse over the weekend.  Today Spencer Bishop was unable to work because of the leg pain.  It hurts from his buttock all the way down to his lateral ankle.  Worse with movement.  Better with rest.  No numbness.  No weakness.  No history of spine problems or degenerative disc disease.  No prior problems with back or leg pain.  Spencer Bishop does mention that Spencer Bishop did have a DVT on the right leg.  This was years ago.  It went from his groin all the way down to his foot.  His whole leg swelled.  Spencer Bishop has not having similar symptoms today.  Past Medical History:  Diagnosis Date   Alcohol use    Coronary artery calcification    Diabetes mellitus without complication (HCC)    DVT (deep venous thrombosis) (HCC)    Dyspnea on exertion    Hypertension    Osteoarthritis    Peripheral vascular disease (HCC)     Patient Active Problem List   Diagnosis Date Noted   Hyperlipidemia associated with type 2 diabetes mellitus (HCC) 02/13/2024   Dyslipidemia 11/22/2023   Peripheral vascular disease, unspecified (HCC) 11/22/2023   Impingement syndrome, shoulder, right 08/25/2022   Essential hypertension, benign 01/22/2022   Emphysema of lung (HCC) 12/01/2021   Aortic atherosclerosis (HCC) 12/01/2021   Solid nodule of lung greater than 8 mm in diameter 12/01/2021   Primary osteoarthritis of right hip 08/13/2021   Type 2 diabetes mellitus with hyperglycemia, without long-term current use of insulin (HCC) 07/03/2021   Status post cataract extraction and insertion of intraocular lens 01/21/2015   Current every day smoker 01/08/2015   Snores  01/08/2015   Tear of left rotator cuff 11/12/2014   SHOULDER PAIN, RIGHT 06/29/2007   HAND PAIN, RIGHT 06/29/2007    Past Surgical History:  Procedure Laterality Date   RECONSTRUCTION OF NOSE         Home Medications    Prior to Admission medications   Medication Sig Start Date End Date Taking? Authorizing Provider  methylPREDNISolone  (MEDROL  DOSEPAK) 4 MG TBPK tablet tad 03/12/24  Yes Maranda Jamee Jacob, MD  tiZANidine  (ZANAFLEX ) 4 MG tablet Take 1-2 tablets (4-8 mg total) by mouth every 6 (six) hours as needed for muscle spasms. 03/12/24  Yes Maranda Jamee Jacob, MD  traMADol  (ULTRAM ) 50 MG tablet Take 1-2 tablets (50-100 mg total) by mouth 3 (three) times daily as needed. 03/12/24  Yes Maranda Jamee Jacob, MD  albuterol (VENTOLIN HFA) 108 (90 Base) MCG/ACT inhaler Inhale 2 puffs into the lungs every 6 (six) hours as needed for wheezing or cough. 12/27/14   [provider]  aspirin EC 81 MG tablet Take 81 mg by mouth daily. Swallow whole.    [provider]  blood glucose meter kit and supplies KIT Dispense based on patient and insurance preference. Use up to four times daily as directed. Please include lancets, test strips, control solution. Patient taking differently: Inject 1 each into the skin as directed. Dispense  based on patient and insurance preference. Use up to four times daily as directed. Please include lancets, test strips, control solution. 11/18/20   Willo Mini, NP  glucose blood (FREESTYLE LITE) test strip Dx DM E11.9 Check fasting blood sugar daily and 2 hours after largest meal 2 times a week. Patient taking differently: 1 each by Other route See admin instructions. Dx DM E11.9 Check fasting blood sugar daily and 2 hours after largest meal 2 times a week. 12/16/20   Willo Mini, NP  Lancets (FREESTYLE) lancets Dx DM E11.9 Check fasting blood sugar daily and 2 hours after largest meal 2 times a week. Patient taking differently: 1 each by Other route See admin  instructions. Dx DM E11.9 Check fasting blood sugar daily and 2 hours after largest meal 2 times a week. 12/16/20   Willo Mini, NP  metFORMIN  (GLUCOPHAGE  XR) 500 MG 24 hr tablet Take 1 tablet (500 mg total) by mouth daily with breakfast. 08/16/23   Willo Mini, NP  rosuvastatin  (CRESTOR ) 40 MG tablet Take 1 tablet (40 mg total) by mouth daily. 11/22/23 02/20/24  Krasowski, Robert J, MD  sildenafil  (REVATIO ) 20 MG tablet Take 1-5 tablets (20-100 mg total) by mouth as needed. Patient taking differently: Take 20-100 mg by mouth as needed (ED). 08/16/23   Willo Mini, NP  traMADol  (ULTRAM ) 50 MG tablet Take 1 tablet (50 mg total) by mouth daily as needed. 02/08/24   Curtis Debby PARAS, MD  valsartan  (DIOVAN ) 80 MG tablet Take 1 tablet (80 mg total) by mouth daily. 08/16/23   Willo Mini, NP  Varenicline  Tartrate, Starter, (CHANTIX  STARTING MONTH PAK) 0.5 MG X 11 & 1 MG X 42 TBPK Take 1 tablet by mouth as directed. 11/22/23   Bernie Lamar PARAS, MD    Family History Family History  Problem Relation Age of Onset   Heart attack Mother    Diabetes Mother    Heart attack Father     Social History Social History   Tobacco Use   Smoking status: Every Day    Current packs/day: 1.50    Types: Cigarettes   Smokeless tobacco: Never  Vaping Use   Vaping status: Never Used  Substance Use Topics   Alcohol use: Yes    Alcohol/week: 30.0 standard drinks of alcohol    Types: 30 Standard drinks or equivalent per week   Drug use: Yes    Frequency: 7.0 times per week    Types: Marijuana    Comment: daily use     Allergies   Tylenol [acetaminophen]   Review of Systems Review of Systems See HPI  Physical Exam Triage Vital Signs ED Triage Vitals  Encounter Vitals Group     BP 03/12/24 1028 (!) 153/96     Girls Systolic BP Percentile --      Girls Diastolic BP Percentile --      Boys Systolic BP Percentile --      Boys Diastolic BP Percentile --      Pulse Rate 03/12/24 1028 65     Resp  03/12/24 1028 16     Temp 03/12/24 1028 98.3 F (36.8 C)     Temp src --      SpO2 03/12/24 1028 97 %     Weight --      Height --      Head Circumference --      Peak Flow --      Pain Score 03/12/24 1032 5     Pain Loc --  Pain Education --      Exclude from Growth Chart --    No data found.  Updated Vital Signs BP (!) 153/96   Pulse 65   Temp 98.3 F (36.8 C)   Resp 16   SpO2 97%       Physical Exam Constitutional:      General: Spencer Bishop is in acute distress.     Appearance: Spencer Bishop is well-developed.     Comments: Acutely uncomfortable.  Stiff guarded movements  HENT:     Head: Normocephalic and atraumatic.  Eyes:     Conjunctiva/sclera: Conjunctivae normal.     Pupils: Pupils are equal, round, and reactive to light.  Cardiovascular:     Rate and Rhythm: Normal rate.  Pulmonary:     Effort: Pulmonary effort is normal. No respiratory distress.  Abdominal:     General: There is no distension.     Palpations: Abdomen is soft.  Musculoskeletal:        General: Normal range of motion.     Cervical back: Normal range of motion.     Comments: Mild tenderness to deep palpation in the deep left SI and buttock region  Skin:    General: Skin is warm and dry.  Neurological:     General: No focal deficit present.     Mental Status: Spencer Bishop is alert.     Sensory: No sensory deficit.     Motor: No weakness.     Gait: Gait normal.     Deep Tendon Reflexes: Reflexes normal.     Comments: Reflexes are 2+ and equal at the knee and ankle.  Straight leg raise on the left causes increased leg pain with extension beyond 80 degrees.      UC Treatments / Results  Labs (all labs ordered are listed, but only abnormal results are displayed) Labs Reviewed - No data to display  EKG   Radiology DG Lumbar Spine Complete Result Date: 03/12/2024 CLINICAL DATA:  Severe left-sided sciatica EXAM: LUMBAR SPINE - COMPLETE 5 VIEW COMPARISON:  None Available. FINDINGS: There is no evidence of  lumbar spine fracture. Alignment is normal. Multilevel degenerative changes of the lumbar spine characterized by mild intervertebral disc space narrowing and anterior disc osteophyte formation associated with multilevel facet arthropathy, most pronounced at L4-S1. Punctate radiodensities projecting over the expected location of the lower pole left kidney. IMPRESSION: 1. Multilevel degenerative changes of the lumbar spine, most pronounced at L4-S1. 2. Punctate radiodensities projecting over the expected location of the lower pole left kidney, which may represent renal calculi. Electronically Signed   By: Limin  Xu M.D.   On: 03/12/2024 11:45    Procedures Procedures (including critical care time)  Medications Ordered in UC Medications  ketorolac  (TORADOL ) 30 MG/ML injection 30 mg (30 mg Intramuscular Given 03/12/24 1106)    Initial Impression / Assessment and Plan / UC Course  I have reviewed the triage vital signs and the nursing notes.  Pertinent labs & imaging results that were available during my care of the patient were reviewed by me and considered in my medical decision making (see chart for details).     I explained to the patient that his leg pain was caused from his back.  Spencer Bishop has sciatica.  Discussed treatment. 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  Avoid bending and lifting activities Take the Medrol  Dosepak as directed.  Take all of day 1 today.  May take all 6 tablets when you get the prescription filled Take tizanidine  as needed as muscle relaxer.  This is useful to take at bedtime Take your tramadol  as needed for severe pain.  Do not drive on tramadol  See your doctor if not improving by the end of the week   ED Prescriptions     Medication Sig  Dispense Auth. Provider   methylPREDNISolone  (MEDROL  DOSEPAK) 4 MG TBPK tablet tad 21 tablet Maranda Jamee Jacob, MD   tiZANidine  (ZANAFLEX ) 4 MG tablet Take 1-2 tablets (4-8 mg total) by mouth every 6 (six) hours as needed for muscle spasms. 21 tablet Maranda Jamee Jacob, MD   traMADol  (ULTRAM ) 50 MG tablet Take 1-2 tablets (50-100 mg total) by mouth 3 (three) times daily as needed. 15 tablet Maranda Jamee Jacob, MD      I have reviewed the PDMP during this encounter.   Maranda Jamee Jacob, MD 03/12/24 [phone]

## 2024-03-12 NOTE — Telephone Encounter (Signed)
 Went to urgent care.

## 2024-03-12 NOTE — ED Notes (Signed)
 Toradol  injection given by Christina E., RN

## 2024-03-12 NOTE — Discharge Instructions (Signed)
 Get plenty of rest.  Avoid bending and lifting activities Take the Medrol  Dosepak as directed.  Take all of day 1 today.  May take all 6 tablets when you get the prescription filled Take tizanidine  as needed as muscle relaxer.  This is useful to take at bedtime Take your tramadol  as needed for severe pain.  Do not drive on tramadol  See your doctor if not improving by the end of the week

## 2024-03-12 NOTE — Telephone Encounter (Signed)
 FYI Only or Action Required?: FYI only for provider.  Patient was last seen in primary care on 02/13/2024 by Willo Mini, NP.  Called Nurse Triage reporting Leg Pain.  Symptoms began several days ago.  Interventions attempted: Other: Tramadol .  Symptoms are: unchanged.  Triage Disposition: See HCP Within 4 Hours (Or PCP Triage)  Patient/caregiver understands and will follow disposition?: Yes                  Copied from CRM #[id number]. Topic: Clinical - Red Word Triage >> Mar 12, 2024  8:31 AM Carrielelia G wrote: [facility] that prompted transfer to Nurse Triage: Left leg and hip pain,  hx of blood clot Reason for Disposition  History of prior blood clot in leg or lungs (i.e., deep vein thrombosis, pulmonary embolism)  Answer Assessment - Initial Assessment Questions This RN spoke with pt's wife. Pt scheduled for an appointment at urgent care at 10:30 AM today as no appointment availability in office today.  ONSET: When did the pain start?      Thurs/Fri last week; rested over weekend and using a cane LOCATION: Where is the pain located?      Left leg and hip PAIN: How bad is the pain?    (Scale 1-10; or mild, moderate, severe)     3-4/10 pain level CAUSE: What do you think is causing the leg pain?     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

## 2024-03-12 NOTE — ED Triage Notes (Signed)
 Left leg pain since Thursday. Has had ct scan of legs, us  of legs and neck in the past few months. Hx dvt in right leg. Ibuprofen prn and tramadol  once a day.

## 2024-03-25 ENCOUNTER — Emergency Department (HOSPITAL_BASED_OUTPATIENT_CLINIC_OR_DEPARTMENT_OTHER)

## 2024-03-25 ENCOUNTER — Emergency Department (HOSPITAL_BASED_OUTPATIENT_CLINIC_OR_DEPARTMENT_OTHER)
Admission: EM | Admit: 2024-03-25 | Discharge: 2024-03-25 | Disposition: A | Discharge status: Home / Self Care | Attending: Emergency Medicine | Admitting: Emergency Medicine

## 2024-03-25 ENCOUNTER — Ambulatory Visit
Admission: RE | Admit: 2024-03-25 | Discharge: 2024-03-25 | Source: Ambulatory Visit | Attending: Medical-Surgical | Admitting: Medical-Surgical

## 2024-03-25 ENCOUNTER — Encounter (HOSPITAL_BASED_OUTPATIENT_CLINIC_OR_DEPARTMENT_OTHER): Payer: Self-pay

## 2024-03-25 ENCOUNTER — Other Ambulatory Visit: Payer: Self-pay

## 2024-03-25 VITALS — BP 155/93 | HR 55 | Temp 98.2°F | Resp 22 | Ht 68.0 in | Wt 190.0 lb

## 2024-03-25 DIAGNOSIS — M5442 Lumbago with sciatica, left side: Secondary | ICD-10-CM | POA: Diagnosis not present

## 2024-03-25 DIAGNOSIS — M79605 Pain in left leg: Secondary | ICD-10-CM

## 2024-03-25 DIAGNOSIS — Z7982 Long term (current) use of aspirin: Secondary | ICD-10-CM | POA: Diagnosis not present

## 2024-03-25 MED ORDER — KETOROLAC TROMETHAMINE 30 MG/ML IJ SOLN
30.0000 mg | Freq: Once | INTRAMUSCULAR | Status: AC
  Administered 2024-03-25: 30 mg via INTRAMUSCULAR
  Filled 2024-03-25: qty 1

## 2024-03-25 MED ORDER — METHOCARBAMOL 500 MG PO TABS
500.0000 mg | ORAL_TABLET | Freq: Two times a day (BID) | ORAL | 0 refills | Status: DC
Start: 2024-03-25 — End: 2024-04-12

## 2024-03-25 MED ORDER — OXYCODONE HCL 5 MG PO TABS: 5.0000 mg | ORAL_TABLET | ORAL | 0 refills | Status: AC | PRN

## 2024-03-25 MED ORDER — MORPHINE SULFATE (PF) 4 MG/ML IV SOLN
4.0000 mg | Freq: Once | INTRAVENOUS | Status: AC
  Administered 2024-03-25: 4 mg via INTRAMUSCULAR
  Filled 2024-03-25: qty 1

## 2024-03-25 MED ORDER — PREDNISONE 50 MG PO TABS
60.0000 mg | ORAL_TABLET | Freq: Once | ORAL | Status: AC
  Administered 2024-03-25: 60 mg via ORAL
  Filled 2024-03-25: qty 1

## 2024-03-25 MED ORDER — PREDNISONE 50 MG PO TABS
50.0000 mg | ORAL_TABLET | Freq: Every day | ORAL | 0 refills | Status: AC
Start: 2024-03-25 — End: 2024-03-30

## 2024-03-25 MED ORDER — LIDOCAINE 5 % EX PTCH: 1.0000 | MEDICATED_PATCH | CUTANEOUS | 0 refills | Status: AC

## 2024-03-25 MED ORDER — LIDOCAINE 5 % EX PTCH
1.0000 | MEDICATED_PATCH | CUTANEOUS | Status: DC
  Administered 2024-03-25: 1 via TRANSDERMAL
  Filled 2024-03-25: qty 1

## 2024-03-25 NOTE — ED Triage Notes (Addendum)
 Patient states throbbing pain from left hip to ankle that he was seen for 7/28 and has gotten worse since then.  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  In April 2025 saw vascular and was told he had occlusions in right leg, will see Cardiology again 8/15   Took Ibuprofen and Tramadol  this morning without relief

## 2024-03-25 NOTE — ED Provider Notes (Signed)
 [place] [facility] Provider Note   CSN: [id number] Arrival date & time: 03/25/24  1224     Patient presents with: Leg Pain   Spencer Bishop is a 63 y.o. male here for evaluation of left lower extremity pain.  Started approximately 2 weeks ago.  Worse with movement.  Starts at his left SI, piriformis region goes to the posterior aspect of his leg.  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  He is not anticoagulated.  No edema, erythema, warmth, numbness or weakness.  No bowel or bladder incontinence, saddle paresthesia, fever, history of IVDU.  He initially was seen by urgent care prescribed tramadol  and prednisone .  Symptoms initially slightly improved however returned.  He has history of diabetes.  He is a tobacco user.  States he is followed by Dr. Bernie with cardiology who recently got ABIs on him due to his history and cardiac imaging.  Has noted bilateral PAD.  Intermittently taking tramadol  and ibuprofen without relief.  No recent falls or injuries.  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  No chest pain, shortness of breath.  X-ray initially performed urgent care shows multilevel generative changes lumbar spine most pronounced at L4 S1   HPI     Prior to Admission medications   Medication Sig Start Date End Date Taking? Authorizing Provider  lidocaine  (LIDODERM ) 5 % Place 1 patch onto the skin daily. Remove & Discard patch within 12 hours or as directed by MD 03/25/24  Yes Zayneb Baucum A, PA-C  methocarbamol  (ROBAXIN ) 500 MG tablet Take 1 tablet (500 mg total) by mouth 2 (two) times daily. 03/25/24  Yes Melissaann Dizdarevic A, PA-C  oxyCODONE  (ROXICODONE ) 5 MG immediate release tablet Take 1 tablet (5 mg total) by mouth every 4 (four) hours as needed for up to 4 days for severe pain (pain score 7-10). 03/25/24 03/29/24 Yes Juwan Vences A, PA-C  predniSONE  (DELTASONE ) 50 MG tablet Take 1 tablet  (50 mg total) by mouth daily for 5 days. 03/25/24 03/30/24 Yes Jesslynn Kruck A, PA-C  albuterol (VENTOLIN HFA) 108 (90 Base) MCG/ACT inhaler Inhale 2 puffs into the lungs every 6 (six) hours as needed for wheezing or cough. 12/27/14   [provider]  aspirin EC 81 MG tablet Take 81 mg by mouth daily. Swallow whole.    [provider]  blood glucose meter kit and supplies KIT Dispense based on patient and insurance preference. Use up to four times daily as directed. Please include lancets, test strips, control solution. Patient taking differently: Inject 1 each into the skin as directed. Dispense based on patient and insurance preference. Use up to four times daily as directed. Please include lancets, test strips, control solution. 11/18/20   Willo Mini, NP  glucose blood (FREESTYLE LITE) test strip Dx DM E11.9 Check fasting blood sugar daily and 2 hours after largest meal 2 times a week. Patient taking differently: 1 each by Other route See admin instructions. Dx DM E11.9 Check fasting blood sugar daily and 2 hours after largest meal 2 times a week. 12/16/20   Willo Mini, NP  Lancets (FREESTYLE) lancets Dx DM E11.9 Check fasting blood sugar daily and 2 hours after largest meal 2 times a week. Patient taking differently: 1 each by Other route See admin instructions. Dx DM E11.9 Check fasting blood sugar daily and 2 hours after largest meal 2 times a week. 12/16/20  Willo Mini, NP  metFORMIN  (GLUCOPHAGE  XR) 500 MG 24 hr tablet Take 1 tablet (500 mg total) by mouth daily with breakfast. 08/16/23   Willo Mini, NP  methylPREDNISolone  (MEDROL  DOSEPAK) 4 MG TBPK tablet tad 03/12/24   Maranda Jamee Jacob, MD  rosuvastatin  (CRESTOR ) 40 MG tablet Take 1 tablet (40 mg total) by mouth daily. 11/22/23 02/20/24  Krasowski, Robert J, MD  sildenafil  (REVATIO ) 20 MG tablet Take 1-5 tablets (20-100 mg total) by mouth as needed. Patient taking differently: Take 20-100 mg by mouth as needed (ED). 08/16/23    Willo Mini, NP  tiZANidine  (ZANAFLEX ) 4 MG tablet Take 1-2 tablets (4-8 mg total) by mouth every 6 (six) hours as needed for muscle spasms. 03/12/24   Maranda Jamee Jacob, MD  traMADol  (ULTRAM ) 50 MG tablet Take 1 tablet (50 mg total) by mouth daily as needed. 02/08/24   Curtis Debby PARAS, MD  traMADol  (ULTRAM ) 50 MG tablet Take 1-2 tablets (50-100 mg total) by mouth 3 (three) times daily as needed. 03/12/24   Maranda Jamee Jacob, MD  valsartan  (DIOVAN ) 80 MG tablet Take 1 tablet (80 mg total) by mouth daily. 08/16/23   Willo Mini, NP  Varenicline  Tartrate, Starter, (CHANTIX  STARTING MONTH PAK) 0.5 MG X 11 & 1 MG X 42 TBPK Take 1 tablet by mouth as directed. 11/22/23   Krasowski, Robert J, MD    Allergies: Tylenol [acetaminophen]    Review of Systems  Constitutional: Negative.   HENT: Negative.    Respiratory: Negative.    Cardiovascular: Negative.   Gastrointestinal: Negative.   Genitourinary: Negative.   Musculoskeletal:  Positive for back pain.       Left lower back, left leg pain  Neurological: Negative.   All other systems reviewed and are negative.   Updated Vital Signs BP (!) 159/89 (BP Location: Right Arm)   Pulse (!) 56   Temp 97.8 F (36.6 C)   Resp 20   Wt 86.2 kg   SpO2 100%   BMI 28.89 kg/m   Physical Exam Vitals and nursing note reviewed.  Constitutional:      General: He is not in acute distress.    Appearance: He is well-developed. He is not ill-appearing, toxic-appearing or diaphoretic.  HENT:     Head: Normocephalic and atraumatic.     Nose: Nose normal.     Mouth/Throat:     Mouth: Mucous membranes are moist.  Eyes:     Pupils: Pupils are equal, round, and reactive to light.  Cardiovascular:     Rate and Rhythm: Normal rate and regular rhythm.     Pulses:          Dorsalis pedis pulses are detected w/ Doppler on the left side.     Heart sounds: Normal heart sounds.  Pulmonary:     Effort: Pulmonary effort is normal. No respiratory distress.      Breath sounds: Normal breath sounds.  Abdominal:     General: Bowel sounds are normal. There is no distension.     Palpations: Abdomen is soft.     Tenderness: There is no abdominal tenderness.  Musculoskeletal:     Cervical back: Normal range of motion and neck supple.     Lumbar back: Spasms and tenderness present. Decreased range of motion. Positive left straight leg raise test.       Back:     Comments: Tenderness left lower back, left piriformis SI region positive straight leg raise on left.  His compartments are soft.  No bony tenderness.  Moves extremities without difficulty.  No shortening rotation of legs.  Skin:    General: Skin is warm and dry.     Capillary Refill: Capillary refill takes less than 2 seconds.     Comments: Tactile temperature to bilateral lower extremities.  No edema, erythema or warmth.  No obvious rashes or lesions on exposed skin  Neurological:     General: No focal deficit present.     Mental Status: He is alert and oriented to person, place, and time.     Cranial Nerves: No cranial nerve deficit.     Sensory: No sensory deficit.     Motor: No weakness.     Gait: Gait normal.     Comments: Equal strength Intact sensation Ambulatory, no foot drop     (all labs ordered are listed, but only abnormal results are displayed) Labs Reviewed - No data to display  EKG: None  Radiology: US  Venous Img Lower Unilateral Left Result Date: 03/25/2024 CLINICAL DATA:  pain EXAM: LEFT LOWER EXTREMITY VENOUS DOPPLER ULTRASOUND TECHNIQUE: Gray-scale sonography with graded compression, as well as color Doppler and duplex ultrasound were performed to evaluate the lower extremity deep venous systems from the level of the common femoral vein and including the common femoral, femoral, profunda femoral, popliteal and calf veins including the posterior tibial, peroneal and gastrocnemius veins when visible. The superficial great saphenous vein was also interrogated. Spectral  Doppler was utilized to evaluate flow at rest and with distal augmentation maneuvers in the common femoral, femoral and popliteal veins. COMPARISON:  None Available. FINDINGS: Contralateral Common Femoral Vein: Respiratory phasicity is normal and symmetric with the symptomatic side. No evidence of thrombus. Normal compressibility. Common Femoral Vein: No evidence of thrombus. Normal compressibility, respiratory phasicity and response to augmentation. Saphenofemoral Junction: No evidence of thrombus. Normal compressibility and flow on color Doppler imaging. Profunda Femoral Vein: No evidence of thrombus. Normal compressibility and flow on color Doppler imaging. Femoral Vein: No evidence of thrombus. Normal compressibility, respiratory phasicity and response to augmentation. Popliteal Vein: No evidence of thrombus. Normal compressibility, respiratory phasicity and response to augmentation. Calf Veins: No evidence of thrombus. Normal compressibility and flow on color Doppler imaging. Superficial Great Saphenous Vein: No evidence of thrombus. Normal compressibility. Other Findings:  Peripheral vascular atherosclerosis. IMPRESSION: Negative for deep venous thrombosis in the left leg. Electronically Signed   By: Rogelia Myers M.D.   On: 03/25/2024 16:45     Procedures   Medications Ordered in the ED  lidocaine  (LIDODERM ) 5 % 1 patch (1 patch Transdermal Patch Applied 03/25/24 1433)  predniSONE  (DELTASONE ) tablet 60 mg (60 mg Oral Given 03/25/24 1433)  morphine  (PF) 4 MG/ML injection 4 mg (4 mg Intramuscular Given 03/25/24 1433)  ketorolac  (TORADOL ) 30 MG/ML injection 30 mg (30 mg Intramuscular Given 03/25/24 4132)   63 year old here for evaluation of left lower back pain going into his leg.  No recent injury or trauma.  Known history of PAD however has pulses to the left lower extremity here.  Tactile temperature to extremities.  No numbness or weakness.  He is positive straight leg raise on left.  Seen by  urgent care given tramadol  and Medrol  Dosepak however symptoms returned.  No chest pain or shortness of breath, he was concerned as he has a prior history of DVT on right not currently anticoagulated.  History and exam most consistent with sciatica.  No red flag symptoms, low suspicion for cauda equina, discitis, osteomyelitis, transverse myelitis.  Will treat symptomatically.  Do not feel we need repeat imaging of the back.  He is concerned about DVT will get ultrasound however he has no lower extremity edema, erythema or warmth or enticing events.  Considered ischemia however given exam/pulses present in extremities have low suspicion for acute emergent arterial occlusion.  No recent falls or injuries to suggest fracture or dislocation.  No edema, erythema to joints to suggest septic joint, gout, hemarthrosis.  Imaging personally viewed and interpreted: Ultrasound negative for VTE  Patient reassessed.  We discussed labs and imaging.  I suspect he likely has sciatica.  Will have him follow-up outpatient, return for any worsening symptoms.  Will have him do longer course of steroids, he will check his blood sugars more frequently, DC if greater than 250.  Will have him follow-up outpatient, return for any worsening symptoms.  The patient has been appropriately medically screened and/or stabilized in the ED. I have low suspicion for any other emergent medical condition which would require further screening, evaluation or treatment in the ED or require inpatient management.  Patient is hemodynamically stable and in no acute distress.  Patient able to ambulate in department prior to ED.  Evaluation does not show acute pathology that would require ongoing or additional emergent interventions while in the emergency department or further inpatient treatment.  I have discussed the diagnosis with the patient and answered all questions.  Pain is been managed while in the emergency department and patient has no  further complaints prior to discharge.  Patient is comfortable with plan discussed in room and is stable for discharge at this time.  I have discussed strict return precautions for returning to the emergency department.  Patient was encouraged to follow-up with PCP/specialist refer to at discharge.                                      Medical Decision Making Amount and/or Complexity of Data Reviewed Independent Historian: spouse External Data Reviewed: labs, radiology and notes. Radiology: ordered and independent interpretation performed. Decision-making details documented in ED Course.  Risk OTC drugs. Prescription drug management. Decision regarding hospitalization. Diagnosis or treatment significantly limited by social determinants of health.      Final diagnoses:  Acute left-sided low back pain with left-sided sciatica    ED Discharge Orders          Ordered    oxyCODONE  (ROXICODONE ) 5 MG immediate release tablet  Every 4 hours PRN        03/25/24 1705    predniSONE  (DELTASONE ) 50 MG tablet  Daily        03/25/24 1705    lidocaine  (LIDODERM ) 5 %  Every 24 hours        03/25/24 1705    methocarbamol  (ROBAXIN ) 500 MG tablet  2 times daily        03/25/24 1705               Colonel Krauser A, PA-C 03/25/24 1708    Lenor Hollering, MD 03/30/24 0701

## 2024-03-25 NOTE — Discharge Instructions (Addendum)
 Advised patient to go to [facility] ED now for further evaluation of left leg pain and to rule out possible DVT.

## 2024-03-25 NOTE — Discharge Instructions (Addendum)
 It was a pleasure to care of you here today  I written you for a few medications to help with your symptoms  If your blood sugars are greater than 250 stop taking the prednisone .  Do not take any additional ibuprofen or Aleve while taking the prednisone   Make sure to follow-up with your primary care provider.  I have given you the contact information to a neurosurgeon.  Call to schedule appointment for further evaluation  Return for any worsening symptoms

## 2024-03-25 NOTE — ED Triage Notes (Signed)
 Reports L leg pain from buttock down to foot for 2 weeks. Diagnosed w sciatic pain by UC   Ambulatory, denies loss of sensation or mobility. No obvious swelling noted

## 2024-03-25 NOTE — ED Provider Notes (Signed)
 [name]    CSN: [id number] Arrival date & time: 03/25/24  1123      History   Chief Complaint Chief Complaint  Patient presents with   Leg Pain    Was seen there a couple of weeks ago for sciatic pain. Can not hardly walk and really need some help please. - Entered by patient    HPI RAJA LISKA is a 63 y.o. male.   HPI 63 year old male presents with 10 of 10 left leg pain and is concerned with possible left leg DVT.  PMH significant for DVT, CAD, current everyday cigarette smoker and T2DM without complication.  Patient reports having left hip pain to ankle which is throbbing.  Patient reports history of right leg DVT with similar symptoms 3 years ago's.  Report he was evaluated in April of this year with cardiology and vascular and was told he had occlusions of right leg.  Patient reports tramadol  and ibuprofen is not touching 10 out of 10 left leg pain.  Patient is accompanied by his wife today.  Past Medical History:  Diagnosis Date   Alcohol use    Coronary artery calcification    Diabetes mellitus without complication (HCC)    DVT (deep venous thrombosis) (HCC)    Dyspnea on exertion    Hypertension    Osteoarthritis    Peripheral vascular disease (HCC)     Patient Active Problem List   Diagnosis Date Noted   Hyperlipidemia associated with type 2 diabetes mellitus (HCC) 02/13/2024   Dyslipidemia 11/22/2023   Peripheral vascular disease, unspecified (HCC) 11/22/2023   Impingement syndrome, shoulder, right 08/25/2022   Essential hypertension, benign 01/22/2022   Emphysema of lung (HCC) 12/01/2021   Aortic atherosclerosis (HCC) 12/01/2021   Solid nodule of lung greater than 8 mm in diameter 12/01/2021   Primary osteoarthritis of right hip 08/13/2021   Type 2 diabetes mellitus with hyperglycemia, without long-term current use of insulin (HCC) 07/03/2021   Status post cataract extraction and insertion of intraocular lens 01/21/2015   Current every day  smoker 01/08/2015   Snores 01/08/2015   Tear of left rotator cuff 11/12/2014   SHOULDER PAIN, RIGHT 06/29/2007   HAND PAIN, RIGHT 06/29/2007    Past Surgical History:  Procedure Laterality Date   RECONSTRUCTION OF NOSE         Home Medications    Prior to Admission medications   Medication Sig Start Date End Date Taking? Authorizing Provider  albuterol (VENTOLIN HFA) 108 (90 Base) MCG/ACT inhaler Inhale 2 puffs into the lungs every 6 (six) hours as needed for wheezing or cough. 12/27/14   [provider]  aspirin EC 81 MG tablet Take 81 mg by mouth daily. Swallow whole.    [provider]  blood glucose meter kit and supplies KIT Dispense based on patient and insurance preference. Use up to four times daily as directed. Please include lancets, test strips, control solution. Patient taking differently: Inject 1 each into the skin as directed. Dispense based on patient and insurance preference. Use up to four times daily as directed. Please include lancets, test strips, control solution. 11/18/20   Willo Mini, NP  glucose blood (FREESTYLE LITE) test strip Dx DM E11.9 Check fasting blood sugar daily and 2 hours after largest meal 2 times a week. Patient taking differently: 1 each by Other route See admin instructions. Dx DM E11.9 Check fasting blood sugar daily and 2 hours after largest meal 2 times a week. 12/16/20  Willo Mini, NP  Lancets (FREESTYLE) lancets Dx DM E11.9 Check fasting blood sugar daily and 2 hours after largest meal 2 times a week. Patient taking differently: 1 each by Other route See admin instructions. Dx DM E11.9 Check fasting blood sugar daily and 2 hours after largest meal 2 times a week. 12/16/20   Willo Mini, NP  metFORMIN  (GLUCOPHAGE  XR) 500 MG 24 hr tablet Take 1 tablet (500 mg total) by mouth daily with breakfast. 08/16/23   Willo Mini, NP  methylPREDNISolone  (MEDROL  DOSEPAK) 4 MG TBPK tablet tad 03/12/24   Maranda Jamee Jacob, MD  rosuvastatin   (CRESTOR ) 40 MG tablet Take 1 tablet (40 mg total) by mouth daily. 11/22/23 02/20/24  Krasowski, Robert J, MD  sildenafil  (REVATIO ) 20 MG tablet Take 1-5 tablets (20-100 mg total) by mouth as needed. Patient taking differently: Take 20-100 mg by mouth as needed (ED). 08/16/23   Willo Mini, NP  tiZANidine  (ZANAFLEX ) 4 MG tablet Take 1-2 tablets (4-8 mg total) by mouth every 6 (six) hours as needed for muscle spasms. 03/12/24   Maranda Jamee Jacob, MD  traMADol  (ULTRAM ) 50 MG tablet Take 1 tablet (50 mg total) by mouth daily as needed. 02/08/24   Curtis Debby PARAS, MD  traMADol  (ULTRAM ) 50 MG tablet Take 1-2 tablets (50-100 mg total) by mouth 3 (three) times daily as needed. 03/12/24   Maranda Jamee Jacob, MD  valsartan  (DIOVAN ) 80 MG tablet Take 1 tablet (80 mg total) by mouth daily. 08/16/23   Willo Mini, NP  Varenicline  Tartrate, Starter, (CHANTIX  STARTING MONTH PAK) 0.5 MG X 11 & 1 MG X 42 TBPK Take 1 tablet by mouth as directed. 11/22/23   Bernie Lamar PARAS, MD    Family History Family History  Problem Relation Age of Onset   Heart attack Mother    Diabetes Mother    Heart attack Father     Social History Social History   Tobacco Use   Smoking status: Every Day    Current packs/day: 1.50    Types: Cigarettes   Smokeless tobacco: Never  Vaping Use   Vaping status: Never Used  Substance Use Topics   Alcohol use: Not Currently    Alcohol/week: 21.0 standard drinks of alcohol    Types: 21 Cans of beer per week    Comment: 3-4 nightly   Drug use: Yes    Frequency: 7.0 times per week    Types: Marijuana    Comment: daily use     Allergies   Tylenol [acetaminophen]   Review of Systems Review of Systems  Musculoskeletal:        Left leg pain 10/10 since this morning patient concerned with possible DVT     Physical Exam Triage Vital Signs ED Triage Vitals  Encounter Vitals Group     BP      Girls Systolic BP Percentile      Girls Diastolic BP Percentile      Boys  Systolic BP Percentile      Boys Diastolic BP Percentile      Pulse      Resp      Temp      Temp src      SpO2      Weight      Height      Head Circumference      Peak Flow      Pain Score      Pain Loc      Pain Education  Exclude from Growth Chart    No data found.  Updated Vital Signs BP (!) 155/93 (BP Location: Right Arm)   Pulse (!) 55   Temp 98.2 F (36.8 C) (Oral)   Resp (!) 22   Ht 5' 8 (1.727 m)   Wt 190 lb (86.2 kg)   SpO2 98%   BMI 28.89 kg/m    Physical Exam Vitals and nursing note reviewed.  Constitutional:      Appearance: Normal appearance. He is obese. He is ill-appearing.  HENT:     Head: Normocephalic and atraumatic.     Mouth/Throat:     Mouth: Mucous membranes are moist.     Pharynx: Oropharynx is clear.  Eyes:     Extraocular Movements: Extraocular movements intact.     Conjunctiva/sclera: Conjunctivae normal.     Pupils: Pupils are equal, round, and reactive to light.  Cardiovascular:     Rate and Rhythm: Normal rate and regular rhythm.     Pulses: Normal pulses.     Heart sounds: Normal heart sounds.  Pulmonary:     Effort: Pulmonary effort is normal.     Breath sounds: Normal breath sounds. No wheezing, rhonchi or rales.  Musculoskeletal:        General: Normal range of motion.     Cervical back: Normal range of motion and neck supple.  Skin:    General: Skin is warm and dry.  Neurological:     General: No focal deficit present.     Mental Status: He is alert and oriented to person, place, and time.  Psychiatric:        Mood and Affect: Mood normal.        Behavior: Behavior normal.        Thought Content: Thought content normal.      UC Treatments / Results  Labs (all labs ordered are listed, but only abnormal results are displayed) Labs Reviewed - No data to display  EKG   Radiology No results found.  Procedures Procedures (including critical care time)  Medications Ordered in UC Medications - No data  to display  Initial Impression / Assessment and Plan / UC Course  I have reviewed the triage vital signs and the nursing notes.  Pertinent labs & imaging results that were available during my care of the patient were reviewed by me and considered in my medical decision making (see chart for details).     MDM: 1.  Left leg pain-Advised patient to go to [facility] ED now for further evaluation of left leg pain.  Patient/wife agreed and verbalized understanding of these instructions and this plan of care today.  Patient discharged to ED, hemodynamically stable. Final Clinical Impressions(s) / UC Diagnoses   Final diagnoses:  Left leg pain     Discharge Instructions      Advised patient to go to [facility] ED now for further evaluation of left leg pain and to rule out possible DVT.     ED Prescriptions   None    PDMP not reviewed this encounter.   Teddy Sharper, FNP 03/25/24 1351

## 2024-03-29 DIAGNOSIS — F1090 Alcohol use, unspecified, uncomplicated: Secondary | ICD-10-CM | POA: Insufficient documentation

## 2024-03-29 DIAGNOSIS — I82409 Acute embolism and thrombosis of unspecified deep veins of unspecified lower extremity: Secondary | ICD-10-CM | POA: Insufficient documentation

## 2024-03-29 DIAGNOSIS — I10 Essential (primary) hypertension: Secondary | ICD-10-CM | POA: Insufficient documentation

## 2024-03-29 DIAGNOSIS — R0609 Other forms of dyspnea: Secondary | ICD-10-CM | POA: Insufficient documentation

## 2024-03-29 DIAGNOSIS — E119 Type 2 diabetes mellitus without complications: Secondary | ICD-10-CM | POA: Insufficient documentation

## 2024-03-29 DIAGNOSIS — M1990 Unspecified osteoarthritis, unspecified site: Secondary | ICD-10-CM | POA: Insufficient documentation

## 2024-03-29 DIAGNOSIS — I2510 Atherosclerotic heart disease of native coronary artery without angina pectoris: Secondary | ICD-10-CM | POA: Insufficient documentation

## 2024-03-30 ENCOUNTER — Other Ambulatory Visit: Payer: Self-pay

## 2024-03-30 ENCOUNTER — Ambulatory Visit: Admitting: Cardiology

## 2024-04-03 ENCOUNTER — Other Ambulatory Visit: Payer: Self-pay

## 2024-04-03 DIAGNOSIS — M5416 Radiculopathy, lumbar region: Secondary | ICD-10-CM

## 2024-04-06 ENCOUNTER — Ambulatory Visit
Admission: RE | Admit: 2024-04-06 | Discharge: 2024-04-06 | Disposition: A | Discharge status: Home / Self Care | Source: Ambulatory Visit

## 2024-04-06 DIAGNOSIS — M5416 Radiculopathy, lumbar region: Secondary | ICD-10-CM

## 2024-04-12 ENCOUNTER — Other Ambulatory Visit: Payer: Self-pay

## 2024-04-12 NOTE — Progress Notes (Signed)
 Left message for the patient to contact Pre-Admission Testing at [phone]

## 2024-04-13 ENCOUNTER — Encounter (HOSPITAL_COMMUNITY): Payer: Self-pay

## 2024-04-13 ENCOUNTER — Other Ambulatory Visit: Payer: Self-pay

## 2024-04-13 NOTE — Progress Notes (Signed)
 PCP - Zada Palin, NP Cardiologist - Lamar Fitch, MD  EKG - 11/22/23 Stress Test - 02/28/24  Checks Blood Sugar 0/day  Aspirin Instructions: Last dose 04/10/24  Anesthesia review: Y  Patient verbally denies any shortness of breath, fever, cough and chest pain during phone call   -------------  SDW INSTRUCTIONS given:  Your procedure is scheduled on Tuesday, Sept 2nd.  Report to [facility] Main Entrance A at 0530 A.M., and check in at the Admitting office.  Call this number if you have problems the morning of surgery:  [phone]   Remember:  Do not eat or drink after midnight the night before your surgery    Take these medicines the morning of surgery with A SIP OF WATER  albuterol (VENTOLIN HFA)-if needed (Please bring on the day of surgery) Mucinex-if needed oxyCODONE -if needed tiZANidine  (ZANAFLEX )-if needed traMADol  (ULTRAM )-if needed  **DO NOT** take your metformin  the day of surgery  ** PLEASE check your blood sugar the morning of your surgery when you wake up and every 2 hours until you get to the Short Stay unit.  If your blood sugar is less than 70 mg/dL, you will need to treat for low blood sugar: Do not take insulin. Treat a low blood sugar (less than 70 mg/dL) with  cup of clear juice (cranberry or apple), 4 glucose tablets, OR glucose gel. Recheck blood sugar in 15 minutes after treatment (to make sure it is greater than 70 mg/dL). If your blood sugar is not greater than 70 mg/dL on recheck, call [phone] for further instructions.   As of today, STOP taking any Aspirin (unless otherwise instructed by your surgeon) Aleve, Naproxen, Ibuprofen, Motrin, Advil, Goody's, BC's, all herbal medications, fish oil, and all vitamins.                      Do not wear jewelry, make up, or nail polish            Do not wear lotions, powders, perfumes/colognes, or deodorant.            Do not shave 48 hours prior to surgery.  Men may shave face and neck.             Do not bring valuables to the hospital.            [facility] is not responsible for any belongings or valuables.  Do NOT Smoke (Tobacco/Vaping) 24 hours prior to your procedure If you use a CPAP at night, you may bring all equipment for your overnight stay.   Contacts, glasses, dentures or bridgework may not be worn into surgery.      For patients admitted to the hospital, discharge time will be determined by your treatment team.   Patients discharged the day of surgery will not be allowed to drive home, and someone needs to stay with them for 24 hours.    Special instructions:   [place]- Preparing For Surgery  Before surgery, you can play an important role. Because skin is not sterile, your skin needs to be as free of germs as possible. You can reduce the number of germs on your skin by washing with CHG (chlorahexidine gluconate) Soap before surgery.  CHG is an antiseptic cleaner which kills germs and bonds with the skin to continue killing germs even after washing.    Oral Hygiene is also important to reduce your risk of infection.  Remember - BRUSH YOUR TEETH THE MORNING OF SURGERY  WITH YOUR REGULAR TOOTHPASTE  Please do not use if you have an allergy to CHG or antibacterial soaps. If your skin becomes reddened/irritated stop using the CHG.  Do not shave (including legs and underarms) for at least 48 hours prior to first CHG shower. It is OK to shave your face.  Please follow these instructions carefully.   Shower the NIGHT BEFORE SURGERY and the MORNING OF SURGERY with DIAL Soap.   Pat yourself dry with a CLEAN TOWEL.  Wear CLEAN PAJAMAS to bed the night before surgery  Place CLEAN SHEETS on your bed the night of your first shower and DO NOT SLEEP WITH PETS.   Day of Surgery: Please shower morning of surgery  Wear Clean/Comfortable clothing the morning of surgery Do not apply any deodorants/lotions.   Remember to brush your teeth WITH YOUR REGULAR TOOTHPASTE.    Questions were answered. Patient verbalized understanding of instructions.

## 2024-04-13 NOTE — Anesthesia Preprocedure Evaluation (Addendum)
 Anesthesia Evaluation  Patient identified by MRN, date of birth, ID band Patient awake    Reviewed: Allergy & Precautions, NPO status , Patient's Chart, lab work & pertinent test results  Airway Mallampati: III  TM Distance: >3 FB Neck ROM: Full    Dental  (+) Teeth Intact, Dental Advisory Given   Pulmonary COPD,  COPD inhaler, Current SmokerPatient did not abstain from smoking.   Pulmonary exam normal breath sounds clear to auscultation       Cardiovascular hypertension, Pt. on medications + CAD and + Peripheral Vascular Disease  Normal cardiovascular exam Rhythm:Regular Rate:Normal     Neuro/Psych negative neurological ROS  negative psych ROS   GI/Hepatic negative GI ROS,,,(+)     substance abuse  alcohol use  Endo/Other  diabetes, Type 2, Oral Hypoglycemic Agents    Renal/GU negative Renal ROS     Musculoskeletal  (+) Arthritis ,    Abdominal   Peds  Hematology negative hematology ROS (+)   Anesthesia Other Findings   Reproductive/Obstetrics                              Anesthesia Physical Anesthesia Plan  ASA: 3  Anesthesia Plan: General   Post-op Pain Management: Ketamine  IV*   Induction: Intravenous  PONV Risk Score and Plan: 2 and Midazolam , Dexamethasone  and Ondansetron   Airway Management Planned: Oral ETT  Additional Equipment:   Intra-op Plan:   Post-operative Plan: Extubation in OR  Informed Consent: I have reviewed the patients History and Physical, chart, labs and discussed the procedure including the risks, benefits and alternatives for the proposed anesthesia with the patient or authorized representative who has indicated his/her understanding and acceptance.     Dental advisory given  Plan Discussed with: CRNA  Anesthesia Plan Comments: (PAT note written 04/13/2024 by Allison Zelenak, PA-C.  )         Anesthesia Quick Evaluation

## 2024-04-13 NOTE — Progress Notes (Signed)
 Anesthesia Chart Review: Spencer Bishop   Case: [id number] Date/Time: 04/17/24 0715   Procedure: LUMBAR LAMINECTOMY FOR EPIDURAL ABSCESS (Left) - MIS L5-S1 LEFT LAM FOR FACET/SYNOVIAL CYST, LEFT L45 LAMINOFORAMINOTOMY W/METRX   Anesthesia type: General   Diagnosis: Left lumbar radiculopathy [M54.16]   Pre-op diagnosis: left lumbar radiculopathy   [facility] ROOM 20 / [facility] OR   Surgeons: Darnella Dorn SAUNDERS, MD       DISCUSSION: Patient is a 63 year old male scheduled for the above procedure.  History includes smoking, HTN, DM2, HLD, CAD (coronary & aortic calcifications on CT 09/01/2023), emphysema, exertional dyspnea, alcohol use, DVT (RLE 08/26/2021), PVD, lung nodule, nose reconstruction. 3-4 beers/night is documented.    He was evaluated by cardiologist Dr. Bernie in early 2025 for diffuse atherosclerosis. Carotid US  in 11/2023 showed 1-39% RICA and 60-79% LICA stenosis. A nuclear stress test was order for DOE and claudication symptoms and was done on 02/28/2024 showing normal perfusion, LVEF 64%, low risk. No chest pain. Smoking cessation advised. He is on ASA 81 mg and Crestor  40 mg daily. Valsartan  80 mg daily for HTN.   A1c 5.6% on 02/13/2024. He is on metformin .  Last aspirin reported as 04/10/2024.  Anesthesia team to evaluate on the day of surgery.   VS: Ht 5' 8 (1.727 m)   Wt 86.2 kg   BMI 28.89 kg/m   BP Readings from Last 3 Encounters:  03/25/24 (!) 159/89  03/25/24 (!) 155/93  03/12/24 (!) 153/96   Pulse Readings from Last 3 Encounters:  03/25/24 (!) 56  03/25/24 (!) 55  03/12/24 65     PROVIDERS: Willo Mini, NP is PCP  Bernie Charleston, MD is cardiologist   LABS: Most recent lab results include: Lab Results  Component Value Date   WBC 5.5 02/13/2024   HGB 15.8 02/13/2024   HCT 47.2 02/13/2024   PLT 126 (L) 02/13/2024   GLUCOSE 118 (H) 02/13/2024   CHOL 127 01/24/2024   TRIG 92 01/24/2024   HDL 48 01/24/2024   LDLCALC 61 01/24/2024   ALT 14  02/13/2024   AST 19 02/13/2024   NA 138 02/13/2024   K 4.9 02/13/2024   CL 102 02/13/2024   CREATININE 0.89 02/13/2024   BUN 12 02/13/2024   CO2 20 02/13/2024   TSH 0.79 11/14/2020   HGBA1C 5.6 02/13/2024   HGBA1C 5.6 02/13/2024   MICROALBUR 10 02/13/2024     IMAGES: MRI L-spine 04/06/2024: IMPRESSION: 1. Transitional lumbosacral anatomy as above. 2. Severe facet arthrosis at L5-S1 with facet edema, grade 1 anterolisthesis, moderate spinal stenosis, and asymmetric left lateral recess and left neural foraminal stenosis. 3. Mild spinal stenosis at L3-4 and L4-5.   CT Chest LCS 09/01/2023: IMPRESSION: 1. Lung-RADS 2, benign appearance or behavior. Continue annual screening with low-dose chest CT without contrast in 12 months. 2. Age advanced three-vessel coronary artery calcification. 3.  Aortic atherosclerosis (ICD10-I70.0). 4.  Emphysema (ICD10-J43.9).    EKG: EKG 11/22/2023: Sinus rhythm with Premature atrial complexes in a pattern of bigeminy Left anterior fascicular block No previous ECGs available Confirmed by Bernie Charleston [phone]) on 11/22/2023 9:11:53 AM   CV: Venous US  LLE 03/25/2024: IMPRESSION: Negative for deep venous thrombosis in the left leg.   Nuclear stress test 02/28/2024:   The study is normal. The study is low risk.   No ST deviation was noted.   LV perfusion is normal. There is no evidence of ischemia. There is no evidence of infarction.   Left ventricular  function is normal. Nuclear stress EF: 64%. The left ventricular ejection fraction is normal (55-65%). End diastolic cavity size is normal. End systolic cavity size is normal.   CT images were obtained for attenuation correction and were examined for the presence of coronary calcium  when appropriate.   Coronary calcium  was present on the attenuation correction CT images. Moderate coronary calcifications were present. Coronary calcifications were present in the left anterior descending artery, left  circumflex artery and right coronary artery distribution(s).   Prior study not available for comparison.    US  Carotid 11/28/2023: Summary:  - Right Carotid: Velocities in the right ICA are consistent with a 1-39% stenosis.  - Left Carotid: Velocities in the left ICA are consistent with a 60-79% stenosis.  - Vertebrals: Bilateral vertebral arteries demonstrate antegrade flow.  - Subclavians: Normal flow hemodynamics were seen in bilateral subclavian arteries.    Past Medical History:  Diagnosis Date   Alcohol use    Aortic atherosclerosis (HCC) 12/01/2021   Coronary artery calcification    Current every day smoker 01/08/2015   Diabetes mellitus without complication (HCC)    DVT (deep venous thrombosis) (HCC)    Chronic per pt   Dyslipidemia 11/22/2023   Dyspnea on exertion    Emphysema of lung (HCC) 12/01/2021   Essential hypertension, benign 01/22/2022   HAND PAIN, RIGHT 06/29/2007   Qualifier: Diagnosis of   By: Waylan DO, Karen         Hyperlipidemia associated with type 2 diabetes mellitus (HCC) 02/13/2024   Hypertension    Impingement syndrome, shoulder, right 08/25/2022   Osteoarthritis    Peripheral vascular disease, unspecified (HCC) 11/22/2023   Primary osteoarthritis of right hip 08/13/2021   SHOULDER PAIN, RIGHT 06/29/2007   Qualifier: Diagnosis of   By: Waylan DO, Karen         Snores 01/08/2015   Formatting of this note might be different from the original.  no apnea or sleep study     Solid nodule of lung greater than 8 mm in diameter 12/01/2021   Status post cataract extraction and insertion of intraocular lens 01/21/2015   Formatting of this note might be different from the original.  OD: 01/21/2015  OS: 01/28/15     Tear of left rotator cuff 11/12/2014   Type 2 diabetes mellitus with hyperglycemia, without long-term current use of insulin (HCC) 07/03/2021    Past Surgical History:  Procedure Laterality Date   CATARACT EXTRACTION Bilateral    RECONSTRUCTION  OF NOSE      MEDICATIONS: No current facility-administered medications for this encounter.    albuterol (VENTOLIN HFA) 108 (90 Base) MCG/ACT inhaler   aspirin EC 81 MG tablet   guaiFENesin (MUCINEX) 600 MG 12 hr tablet   lidocaine  (LIDODERM ) 5 %   metFORMIN  (GLUCOPHAGE  XR) 500 MG 24 hr tablet   oxyCODONE  (OXY IR/ROXICODONE ) 5 MG immediate release tablet   rosuvastatin  (CRESTOR ) 40 MG tablet   sildenafil  (REVATIO ) 20 MG tablet   tiZANidine  (ZANAFLEX ) 4 MG tablet   traMADol  (ULTRAM ) 50 MG tablet   valsartan  (DIOVAN ) 80 MG tablet   blood glucose meter kit and supplies KIT   glucose blood (FREESTYLE LITE) test strip   Lancets (FREESTYLE) lancets   Varenicline  Tartrate, Starter, (CHANTIX  STARTING MONTH PAK) 0.5 MG X 11 & 1 MG X 42 TBPK    Isaiah Ruder, PA-C Surgical Short Stay/Anesthesiology [facility] Phone [phone] [facility] Phone [phone] 04/13/2024 12:21 PM

## 2024-04-15 ENCOUNTER — Other Ambulatory Visit

## 2024-04-17 ENCOUNTER — Other Ambulatory Visit: Payer: Self-pay

## 2024-04-17 ENCOUNTER — Encounter: Payer: Self-pay | Admitting: Sports Medicine

## 2024-04-17 ENCOUNTER — Encounter (HOSPITAL_COMMUNITY): Payer: Self-pay

## 2024-04-17 ENCOUNTER — Ambulatory Visit (HOSPITAL_COMMUNITY)

## 2024-04-17 ENCOUNTER — Encounter (HOSPITAL_COMMUNITY): Admission: RE | Disposition: A | Discharge status: Home / Self Care | Payer: Self-pay | Source: Home / Self Care

## 2024-04-17 ENCOUNTER — Ambulatory Visit (HOSPITAL_COMMUNITY): Admission: RE | Admit: 2024-04-17 | Discharge: 2024-04-17 | Disposition: A | Discharge status: Home / Self Care

## 2024-04-17 ENCOUNTER — Ambulatory Visit (HOSPITAL_COMMUNITY): Payer: Self-pay | Admitting: Vascular Surgery

## 2024-04-17 DIAGNOSIS — Z7984 Long term (current) use of oral hypoglycemic drugs: Secondary | ICD-10-CM | POA: Diagnosis not present

## 2024-04-17 DIAGNOSIS — E785 Hyperlipidemia, unspecified: Secondary | ICD-10-CM | POA: Insufficient documentation

## 2024-04-17 DIAGNOSIS — M7138 Other bursal cyst, other site: Secondary | ICD-10-CM | POA: Diagnosis not present

## 2024-04-17 DIAGNOSIS — Z86718 Personal history of other venous thrombosis and embolism: Secondary | ICD-10-CM | POA: Diagnosis not present

## 2024-04-17 DIAGNOSIS — Z79899 Other long term (current) drug therapy: Secondary | ICD-10-CM | POA: Insufficient documentation

## 2024-04-17 DIAGNOSIS — Z7982 Long term (current) use of aspirin: Secondary | ICD-10-CM | POA: Insufficient documentation

## 2024-04-17 DIAGNOSIS — J439 Emphysema, unspecified: Secondary | ICD-10-CM | POA: Insufficient documentation

## 2024-04-17 DIAGNOSIS — I2510 Atherosclerotic heart disease of native coronary artery without angina pectoris: Secondary | ICD-10-CM | POA: Diagnosis not present

## 2024-04-17 DIAGNOSIS — F17210 Nicotine dependence, cigarettes, uncomplicated: Secondary | ICD-10-CM

## 2024-04-17 DIAGNOSIS — E1151 Type 2 diabetes mellitus with diabetic peripheral angiopathy without gangrene: Secondary | ICD-10-CM | POA: Diagnosis not present

## 2024-04-17 DIAGNOSIS — I10 Essential (primary) hypertension: Secondary | ICD-10-CM | POA: Diagnosis not present

## 2024-04-17 DIAGNOSIS — M5416 Radiculopathy, lumbar region: Secondary | ICD-10-CM | POA: Diagnosis present

## 2024-04-17 DIAGNOSIS — M48061 Spinal stenosis, lumbar region without neurogenic claudication: Secondary | ICD-10-CM | POA: Insufficient documentation

## 2024-04-17 LAB — BASIC METABOLIC PANEL WITH GFR
Anion gap: 11 (ref 5–15)
BUN: 12 mg/dL (ref 8–23)
CO2: 21 mmol/L — ABNORMAL LOW (ref 22–32)
Calcium: 9.4 mg/dL (ref 8.9–10.3)
Chloride: 104 mmol/L (ref 98–111)
Creatinine, Ser: 0.91 mg/dL (ref 0.61–1.24)
GFR, Estimated: >60 mL/min (ref >60)
Glucose, Bld: 107 mg/dL — ABNORMAL HIGH (ref 70–99)
Potassium: 3.8 mmol/L (ref 3.5–5.1)
Sodium: 136 mmol/L (ref 135–145)

## 2024-04-17 LAB — CBC
HCT: 44.1 % (ref 39.0–52.0)
Hemoglobin: 15.8 g/dL (ref 13.0–17.0)
MCH: 32.7 pg (ref 26.0–34.0)
MCHC: 35.8 g/dL (ref 30.0–36.0)
MCV: 91.3 fL (ref 80.0–100.0)
Platelets: 189 K/uL (ref 150–400)
RBC: 4.83 MIL/uL (ref 4.22–5.81)
RDW: 12 % (ref 11.5–15.5)
WBC: 7.1 K/uL (ref 4.0–10.5)
nRBC: 0 % (ref 0.0–0.2)

## 2024-04-17 LAB — SURGICAL PCR SCREEN
MRSA, PCR: NEGATIVE
Staphylococcus aureus: NEGATIVE

## 2024-04-17 LAB — GLUCOSE, CAPILLARY
Glucose-Capillary: 117 mg/dL — ABNORMAL HIGH (ref 70–99)
Glucose-Capillary: 154 mg/dL — ABNORMAL HIGH (ref 70–99)
Glucose-Capillary: 147 mg/dL — ABNORMAL HIGH (ref 70–99)

## 2024-04-17 SURGERY — LAMINECTOMY, SPINE, LUMBAR, WITH EXTRADURAL LESION EXCISION
Anesthesia: General | Site: Back | Laterality: Left

## 2024-04-17 MED ORDER — ONDANSETRON HCL 4 MG/2ML IJ SOLN
INTRAMUSCULAR | Status: AC
  Filled 2024-04-17: qty 2

## 2024-04-17 MED ORDER — METHYLPREDNISOLONE ACETATE 80 MG/ML IJ SUSP
INTRAMUSCULAR | Status: DC | PRN
  Administered 2024-04-17: 80 mg

## 2024-04-17 MED ORDER — KETAMINE HCL 50 MG/5ML IJ SOSY
PREFILLED_SYRINGE | INTRAMUSCULAR | Status: AC
  Filled 2024-04-17: qty 5

## 2024-04-17 MED ORDER — LIDOCAINE-EPINEPHRINE 1 %-1:100000 IJ SOLN
INTRAMUSCULAR | Status: DC | PRN
  Administered 2024-04-17: 10 mL

## 2024-04-17 MED ORDER — CHLORHEXIDINE GLUCONATE CLOTH 2 % EX PADS: 6.0000 | MEDICATED_PAD | Freq: Once | CUTANEOUS | Status: DC

## 2024-04-17 MED ORDER — AMISULPRIDE (ANTIEMETIC) 5 MG/2ML IV SOLN: 10.0000 mg | Freq: Once | INTRAVENOUS | Status: DC | PRN

## 2024-04-17 MED ORDER — ORAL CARE MOUTH RINSE: 15.0000 mL | Freq: Once | OROMUCOSAL | Status: AC

## 2024-04-17 MED ORDER — FENTANYL CITRATE (PF) 250 MCG/5ML IJ SOLN
INTRAMUSCULAR | Status: DC | PRN
  Administered 2024-04-17 (×2): 50 ug via INTRAVENOUS
  Administered 2024-04-17: 100 ug via INTRAVENOUS

## 2024-04-17 MED ORDER — PROPOFOL 10 MG/ML IV BOLUS
INTRAVENOUS | Status: DC | PRN
  Administered 2024-04-17: 140 mg via INTRAVENOUS

## 2024-04-17 MED ORDER — MIDAZOLAM HCL 2 MG/2ML IJ SOLN
INTRAMUSCULAR | Status: AC
  Filled 2024-04-17: qty 2

## 2024-04-17 MED ORDER — FENTANYL CITRATE (PF) 100 MCG/2ML IJ SOLN
INTRAMUSCULAR | Status: AC
  Filled 2024-04-17: qty 2

## 2024-04-17 MED ORDER — CEFAZOLIN SODIUM-DEXTROSE 2-4 GM/100ML-% IV SOLN
2.0000 g | INTRAVENOUS | Status: AC
  Administered 2024-04-17: 2 g via INTRAVENOUS
  Filled 2024-04-17: qty 100

## 2024-04-17 MED ORDER — FENTANYL CITRATE (PF) 250 MCG/5ML IJ SOLN
INTRAMUSCULAR | Status: AC
  Filled 2024-04-17: qty 5

## 2024-04-17 MED ORDER — ROCURONIUM BROMIDE 10 MG/ML (PF) SYRINGE
PREFILLED_SYRINGE | INTRAVENOUS | Status: AC
  Filled 2024-04-17: qty 10

## 2024-04-17 MED ORDER — METHYLPREDNISOLONE ACETATE 80 MG/ML IJ SUSP
INTRAMUSCULAR | Status: AC
Start: 2024-04-17 — End: 2024-04-17
  Filled 2024-04-17: qty 1

## 2024-04-17 MED ORDER — 0.9 % SODIUM CHLORIDE (POUR BTL) OPTIME
TOPICAL | Status: DC | PRN
  Administered 2024-04-17: 1000 mL

## 2024-04-17 MED ORDER — ONDANSETRON HCL 4 MG/2ML IJ SOLN
INTRAMUSCULAR | Status: DC | PRN
  Administered 2024-04-17: 4 mg via INTRAVENOUS

## 2024-04-17 MED ORDER — FENTANYL CITRATE (PF) 100 MCG/2ML IJ SOLN
25.0000 ug | INTRAMUSCULAR | Status: DC | PRN
  Administered 2024-04-17: 50 ug via INTRAVENOUS

## 2024-04-17 MED ORDER — PHENYLEPHRINE 80 MCG/ML (10ML) SYRINGE FOR IV PUSH (FOR BLOOD PRESSURE SUPPORT)
PREFILLED_SYRINGE | INTRAVENOUS | Status: DC | PRN
  Administered 2024-04-17 (×2): 80 ug via INTRAVENOUS

## 2024-04-17 MED ORDER — EPHEDRINE SULFATE-NACL 50-0.9 MG/10ML-% IV SOSY
PREFILLED_SYRINGE | INTRAVENOUS | Status: DC | PRN
  Administered 2024-04-17: 5 mg via INTRAVENOUS

## 2024-04-17 MED ORDER — SUGAMMADEX SODIUM 200 MG/2ML IV SOLN
INTRAVENOUS | Status: DC | PRN
  Administered 2024-04-17: 200 mg via INTRAVENOUS

## 2024-04-17 MED ORDER — HEMOSTATIC AGENTS (NO CHARGE) OPTIME
TOPICAL | Status: DC | PRN
  Administered 2024-04-17: 1 via TOPICAL

## 2024-04-17 MED ORDER — MIDAZOLAM HCL 2 MG/2ML IJ SOLN
INTRAMUSCULAR | Status: DC | PRN
  Administered 2024-04-17: 2 mg via INTRAVENOUS

## 2024-04-17 MED ORDER — ROCURONIUM BROMIDE 10 MG/ML (PF) SYRINGE
PREFILLED_SYRINGE | INTRAVENOUS | Status: DC | PRN
  Administered 2024-04-17: 10 mg via INTRAVENOUS
  Administered 2024-04-17: 60 mg via INTRAVENOUS
  Administered 2024-04-17: 10 mg via INTRAVENOUS

## 2024-04-17 MED ORDER — DEXMEDETOMIDINE HCL IN NACL 80 MCG/20ML IV SOLN
INTRAVENOUS | Status: AC
Start: 2024-04-17 — End: 2024-04-17
  Filled 2024-04-17: qty 20

## 2024-04-17 MED ORDER — CHLORHEXIDINE GLUCONATE 0.12 % MT SOLN
15.0000 mL | Freq: Once | OROMUCOSAL | Status: AC
  Administered 2024-04-17: 15 mL via OROMUCOSAL
  Filled 2024-04-17: qty 15

## 2024-04-17 MED ORDER — VASOPRESSIN 20 UNIT/ML IV SOLN
INTRAVENOUS | Status: AC
  Filled 2024-04-17: qty 1

## 2024-04-17 MED ORDER — ONDANSETRON HCL 4 MG/2ML IJ SOLN: 4.0000 mg | Freq: Once | INTRAMUSCULAR | Status: DC | PRN

## 2024-04-17 MED ORDER — PHENYLEPHRINE 80 MCG/ML (10ML) SYRINGE FOR IV PUSH (FOR BLOOD PRESSURE SUPPORT)
PREFILLED_SYRINGE | INTRAVENOUS | Status: AC
Start: 2024-04-17 — End: 2024-04-17
  Filled 2024-04-17: qty 10

## 2024-04-17 MED ORDER — KETAMINE HCL 50 MG/5ML IJ SOSY
PREFILLED_SYRINGE | INTRAMUSCULAR | Status: DC | PRN
  Administered 2024-04-17: 10 mg via INTRAVENOUS
  Administered 2024-04-17: 20 mg via INTRAVENOUS

## 2024-04-17 MED ORDER — PROPOFOL 10 MG/ML IV BOLUS
INTRAVENOUS | Status: AC
  Filled 2024-04-17: qty 20

## 2024-04-17 MED ORDER — INSULIN ASPART 100 UNIT/ML IJ SOLN: 0.0000 [IU] | INTRAMUSCULAR | Status: DC | PRN

## 2024-04-17 MED ORDER — DEXAMETHASONE SODIUM PHOSPHATE 10 MG/ML IJ SOLN
INTRAMUSCULAR | Status: DC | PRN
  Administered 2024-04-17: 10 mg via INTRAVENOUS

## 2024-04-17 MED ORDER — LIDOCAINE 2% (20 MG/ML) 5 ML SYRINGE
INTRAMUSCULAR | Status: AC
  Filled 2024-04-17: qty 5

## 2024-04-17 MED ORDER — LIDOCAINE-EPINEPHRINE 1 %-1:100000 IJ SOLN
INTRAMUSCULAR | Status: AC
  Filled 2024-04-17: qty 1

## 2024-04-17 MED ORDER — THROMBIN 5000 UNITS EX SOLR: OROMUCOSAL | Status: DC | PRN

## 2024-04-17 MED ORDER — TIZANIDINE HCL 4 MG PO TABS: 4.0000 mg | ORAL_TABLET | Freq: Four times a day (QID) | ORAL | 1 refills | Status: AC | PRN

## 2024-04-17 MED ORDER — OXYCODONE HCL 5 MG PO TABS: 5.0000 mg | ORAL_TABLET | ORAL | 0 refills | Status: AC | PRN

## 2024-04-17 MED ORDER — LACTATED RINGERS IV SOLN: INTRAVENOUS | Status: DC

## 2024-04-17 MED ORDER — THROMBIN 5000 UNITS EX KIT
PACK | CUTANEOUS | Status: AC
  Filled 2024-04-17: qty 1

## 2024-04-17 MED ORDER — LIDOCAINE 2% (20 MG/ML) 5 ML SYRINGE
INTRAMUSCULAR | Status: DC | PRN
  Administered 2024-04-17: 80 mg via INTRAVENOUS

## 2024-04-17 MED ORDER — DEXAMETHASONE SODIUM PHOSPHATE 10 MG/ML IJ SOLN
INTRAMUSCULAR | Status: AC
  Filled 2024-04-17: qty 1

## 2024-04-17 MED ORDER — EPHEDRINE 5 MG/ML INJ
INTRAVENOUS | Status: AC
  Filled 2024-04-17: qty 5

## 2024-04-17 SURGICAL SUPPLY — 42 items
BAG COUNTER SPONGE SURGICOUNT (BAG) ×1 IMPLANT
BLADE CLIPPER SURG (BLADE) IMPLANT
BUR MATCHSTICK NEURO 3.0 LAGG (BURR) IMPLANT
CANISTER SUCTION 3000ML PPV (SUCTIONS) ×1 IMPLANT
DERMABOND ADVANCED .7 DNX12 (GAUZE/BANDAGES/DRESSINGS) IMPLANT
DRAPE C-ARM 42X72 X-RAY (DRAPES) ×2 IMPLANT
DRAPE LAPAROTOMY 100X72X124 (DRAPES) ×1 IMPLANT
DRAPE SURG 17X23 STRL (DRAPES) ×1 IMPLANT
DRAPE UTILITY 15X26 TOWEL STRL (DRAPES) IMPLANT
DRAPE UTILITY XL STRL (DRAPES) ×1 IMPLANT
DURAPREP 26ML APPLICATOR (WOUND CARE) ×1 IMPLANT
ELECTRODE BLDE INSULATED 6.5IN (ELECTROSURGICAL) ×1 IMPLANT
ELECTRODE REM PT RTRN 9FT ADLT (ELECTROSURGICAL) ×1 IMPLANT
GAUZE 4X4 16PLY ~~LOC~~+RFID DBL (SPONGE) IMPLANT
GAUZE SPONGE 4X4 12PLY STRL (GAUZE/BANDAGES/DRESSINGS) IMPLANT
GLOVE INDICATOR 8.5 STRL (GLOVE) ×3 IMPLANT
GLOVE SURG SYN 8.0 PF PI (GLOVE) ×1 IMPLANT
GOWN STRL REUS W/ TWL LRG LVL3 (GOWN DISPOSABLE) ×1 IMPLANT
GOWN STRL REUS W/ TWL XL LVL3 (GOWN DISPOSABLE) ×1 IMPLANT
GOWN STRL REUS W/TWL 2XL LVL3 (GOWN DISPOSABLE) IMPLANT
HEMOSTAT POWDER KIT SURGIFOAM (HEMOSTASIS) IMPLANT
KIT BASIN OR (CUSTOM PROCEDURE TRAY) ×1 IMPLANT
KIT POSITIONER JACKSON TABLE (MISCELLANEOUS) ×1 IMPLANT
KIT TURNOVER KIT B (KITS) ×1 IMPLANT
NDL HYPO 18GX1.5 BLUNT FILL (NEEDLE) IMPLANT
NDL HYPO 22X1.5 SAFETY MO (MISCELLANEOUS) ×1 IMPLANT
NEEDLE HYPO 18GX1.5 BLUNT FILL (NEEDLE) ×1 IMPLANT
NEEDLE HYPO 22X1.5 SAFETY MO (MISCELLANEOUS) ×1 IMPLANT
NS IRRIG 1000ML POUR BTL (IV SOLUTION) ×1 IMPLANT
PACK LAMINECTOMY NEURO (CUSTOM PROCEDURE TRAY) ×1 IMPLANT
PAD ARMBOARD POSITIONER FOAM (MISCELLANEOUS) ×3 IMPLANT
SPIKE FLUID TRANSFER (MISCELLANEOUS) ×1 IMPLANT
SPONGE T-LAP 4X18 ~~LOC~~+RFID (SPONGE) IMPLANT
SURGIFLO W/THROMBIN 8M KIT (HEMOSTASIS) ×1 IMPLANT
SUT MNCRL AB 4-0 PS2 18 (SUTURE) ×1 IMPLANT
SUT VIC AB 1 CT1 18XBRD ANBCTR (SUTURE) ×1 IMPLANT
SUT VIC AB 2-0 CP2 18 (SUTURE) IMPLANT
SUT VIC AB 3-0 SH 8-18 (SUTURE) IMPLANT
SYR 3ML LL SCALE MARK (SYRINGE) IMPLANT
TOWEL GREEN STERILE (TOWEL DISPOSABLE) ×1 IMPLANT
TOWEL GREEN STERILE FF (TOWEL DISPOSABLE) ×1 IMPLANT
WATER STERILE IRR 1000ML POUR (IV SOLUTION) ×1 IMPLANT

## 2024-04-17 NOTE — Anesthesia Procedure Notes (Signed)
 Procedure Name: Intubation Date/Time: 04/17/2024 7:44 AM  Performed by: Delores Dus, CRNAPre-anesthesia Checklist: Patient identified, Emergency Drugs available, Suction available and Patient being monitored Patient Re-evaluated:Patient Re-evaluated prior to induction Oxygen Delivery Method: Circle system utilized Preoxygenation: Pre-oxygenation with 100% oxygen Induction Type: IV induction Ventilation: Mask ventilation without difficulty Laryngoscope Size: Miller and 2 Grade View: Grade II Tube type: Oral Tube size: 7.0 mm Number of attempts: 1 Airway Equipment and Method: Stylet and Oral airway Placement Confirmation: ETT inserted through vocal cords under direct vision, positive ETCO2 and breath sounds checked- equal and bilateral Secured at: 22 cm Tube secured with: Tape Dental Injury: Teeth and Oropharynx as per pre-operative assessment  Comments: Short TMD, 2b view

## 2024-04-17 NOTE — Transfer of Care (Signed)
 Immediate Anesthesia Transfer of Care Note  Patient: Spencer Bishop  Procedure(s) Performed: Minimally Invasive Lumbar five-Sacral one left laminectomy for facet/synovial cyst, Left lumbar four-five laminoforaminotomy (Left: Back)  Patient Location: PACU  Anesthesia Type:General  Level of Consciousness: awake, alert , and oriented  Airway & Oxygen Therapy: Patient Spontanous Breathing  Post-op Assessment: Report given to RN and Post -op Vital signs reviewed and stable  Post vital signs: Reviewed and stable  Last Vitals:  Vitals Value Taken Time  BP 135/76 04/17/24 09:42  Temp    Pulse 72 04/17/24 09:45  Resp 21 04/17/24 09:45  SpO2 96 % 04/17/24 09:45  Vitals shown include unfiled device data.  Last Pain:  Vitals:   04/17/24 0650  TempSrc:   PainSc: 3          Complications: No notable events documented.

## 2024-04-17 NOTE — Anesthesia Postprocedure Evaluation (Signed)
 Anesthesia Post Note  Patient: OLANDER FRIEDL  Procedure(s) Performed: Minimally Invasive Lumbar five-Sacral one left laminectomy for facet/synovial cyst, Left lumbar four-five laminoforaminotomy (Left: Back)     Patient location during evaluation: PACU Anesthesia Type: General Level of consciousness: awake and alert Pain management: pain level controlled Vital Signs Assessment: post-procedure vital signs reviewed and stable Respiratory status: spontaneous breathing, nonlabored ventilation, respiratory function stable and patient connected to nasal cannula oxygen Cardiovascular status: blood pressure returned to baseline and stable Postop Assessment: no apparent nausea or vomiting Anesthetic complications: no   No notable events documented.  Last Vitals:  Vitals:   04/17/24 1015 04/17/24 1030  BP: 112/64 127/76  Pulse: 65 (!) 59  Resp: 13 20  Temp:  36.7 C  SpO2: 93% 93%    Last Pain:  Vitals:   04/17/24 1030  TempSrc:   PainSc: 0-No pain                 Garnette FORBES Skillern

## 2024-04-17 NOTE — Op Note (Signed)
 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 male with history of diabetes and smoking who presented with acute onsets of severe left lower extremity pain that was crippling in nature.  He also had associated left dorsiflexion weakness.  Given the neurologic weakness we felt that surgical decompression would be the best approach.  I explained the risks of CSF leak, nerve root injury, infection, bleeding, general anesthesia risk.  He verbalized understanding  PROCEDURE IN DETAIL: The patient was brought to the operating room. After induction of general anesthesia, the patient was positioned on the operative table in the prone position. All pressure points were meticulously padded. Skin incision was then marked out and prepped and draped in the usual sterile fashion.  I first marked a line via palpation over the spinous processes and midline.  I then marked a second line 1 fingerbreadth lateral to the left side on which I would ultimately make my incision.  We used lateral fluoroscopy to localize the incision over the L4-5 disc base.  We performed a sharp incision with scalpel.  We used electrocautery to dissect across the fascia down to the level of the L4 lamina and L4-5 facet.  We then used serial dilators to place an 18 mm x 60 mm Metrix tube.  Lateral fluoroscopy was used throughout this process to ensure proper placement.  I then used cautery to clear the remaining soft tissue revealing the underlying L4-5 facet joint and L4 lamina.  A localization shot was performed at this point.   High-speed bur was used to create a laminotomy within the L4 lamina and a modest medial L4-5 facetectomy.  We also used a drill to drill the underside of the L4 spinous process to the contralateral side.  We also used the high-speed drill to drill the top portion of the L5 lamina.  Once the ligamentum flavum was exposed, we used curettes and rongeurs to resect it revealing the underlying dura.  We then carefully dissected across the midline and use rongeurs to resect contralateral ligamentum flavum.  We then used curettes and rongeurs to resect the remaining ligamentum flavum within the left L4-5 lateral recess.  The central canal and L4 foramen were widely patent at this point.  Fluoroscopy was used to ensure adequate superior decompression.  We then angled the Metrix tube under lateral fluoroscopy caudally over the L5 pedicle, L5 lamina, and L5-S1 foramen.  We used high-speed bur to drill a laminotomy window of the L5 lamina revealing more ligamentum flavum.  We then used rongeurs to expand the laminotomy window caudally and laterally.  This revealed the ligamentum flavum which was then resected with curettes and rongeurs.  We then used Penfield dissector's to explore the lateral recess and exiting L5 nerve root.  We did visualize a synovial cyst emanating superiorly from the L5-S1 facet joint.  This was dissected free of the dura relatively easily using Penfield dissector's.  The synovial cyst was then resected using rongeurs.  At the end of the dissection, the L5 nerve root was palpated and felt adequately decompressed around the L5 pedicle and into the L5 foramen.  Fluoroscopy was used  to ensure adequate inferior decompression  We copiously irrigated the wound.  Hemostasis was achieved.  Depo-Medrol  was left in the L5-S1 lateral recess.  The Metrix tube was removed.  The fascia was closed with Vicryl sutures the subcutaneous tissue was closed Vicryl sutures the epidermis was closed with a running Monocryl  and glue   At the end of the case all sponge, needle, and instrument counts were correct. The patient was then transferred to the stretcher, extubated, and taken to the post-anesthesia care unit in stable hemodynamic condition.

## 2024-04-17 NOTE — Discharge Instructions (Signed)
 Wound Care No dressing is required over your incision. Keep clean and dry. You can shower 24 hours after surgery. Let water run over incision. Do not scrub. Pat dry Do not put any creams, lotions, or ointments on incision. Do not submerge your incision for the first 6 weeks (pool, ocean, etc)  Activity Walk each and every day, increasing distance each day. No lifting greater than 10 lbs.  Avoid excessive back motion.  Diet Resume your normal diet.  Call Your Doctor If Any of These Occur Redness, drainage, or swelling at the wound.  Temperature greater than 101 degrees. Severe pain not relieved by pain medication. Incision starts to come apart.  Follow Up Appt Call [phone] today for appointment in 6 weeks if you don't already have one or for any problems.  If you have any hardware placed in your spine, you will need an x-ray before your appointment.

## 2024-04-17 NOTE — H&P (Signed)
 63 y/o M w/ hx smoking, DM2 who presented with acute onset severe LLE pain and L foot weakness  Exam: BUE and RLE: full strength LLE: 5/5 IP, quad, ham. 2/5 DF, EHL. 3/5 PF Numbness of the dorsum of the left foot  A/P: OR today for L4-S1 decompression

## 2024-06-12 ENCOUNTER — Ambulatory Visit: Admitting: Cardiology

## 2024-08-17 ENCOUNTER — Other Ambulatory Visit: Payer: Self-pay | Admitting: Medical-Surgical
